# Patient Record
Sex: Male | Born: 1970 | Race: White | Hispanic: No | Marital: Single | Smoking: Never smoker
Health system: Southern US, Community
[De-identification: ages and names within clinical notes are randomized; demographics above are authoritative.]

## PROBLEM LIST (undated history)

## (undated) DIAGNOSIS — N200 Calculus of kidney: Secondary | ICD-10-CM

## (undated) HISTORY — PX: URETHRAL DILATION: SUR417

## (undated) HISTORY — PX: SPLENECTOMY, TOTAL: SHX788

---

## 2000-12-23 ENCOUNTER — Emergency Department (HOSPITAL_COMMUNITY): Admission: EM | Admit: 2000-12-23 | Discharge: 2000-12-23 | Payer: Self-pay | Admitting: *Deleted

## 2001-01-04 ENCOUNTER — Ambulatory Visit (HOSPITAL_COMMUNITY): Admission: RE | Admit: 2001-01-04 | Discharge: 2001-01-04 | Payer: Self-pay | Admitting: Urology

## 2001-01-04 ENCOUNTER — Encounter: Payer: Self-pay | Admitting: Urology

## 2007-11-28 ENCOUNTER — Emergency Department (HOSPITAL_COMMUNITY): Admission: EM | Admit: 2007-11-28 | Discharge: 2007-11-28 | Payer: Self-pay | Admitting: Emergency Medicine

## 2008-01-19 ENCOUNTER — Ambulatory Visit (HOSPITAL_COMMUNITY): Admission: RE | Admit: 2008-01-19 | Discharge: 2008-01-19 | Payer: Self-pay | Admitting: Urology

## 2008-01-23 ENCOUNTER — Encounter (HOSPITAL_COMMUNITY): Admission: RE | Admit: 2008-01-23 | Discharge: 2008-02-22 | Payer: Self-pay | Admitting: Orthopaedic Surgery

## 2008-02-08 ENCOUNTER — Encounter (INDEPENDENT_AMBULATORY_CARE_PROVIDER_SITE_OTHER): Payer: Self-pay | Admitting: Urology

## 2008-02-08 ENCOUNTER — Ambulatory Visit (HOSPITAL_COMMUNITY): Admission: RE | Admit: 2008-02-08 | Discharge: 2008-02-08 | Payer: Self-pay | Admitting: Urology

## 2009-08-29 ENCOUNTER — Inpatient Hospital Stay (HOSPITAL_COMMUNITY)
Admission: EM | Admit: 2009-08-29 | Discharge: 2009-09-03 | Payer: Self-pay | Source: Home / Self Care | Admitting: Emergency Medicine

## 2009-08-30 ENCOUNTER — Encounter (INDEPENDENT_AMBULATORY_CARE_PROVIDER_SITE_OTHER): Payer: Self-pay

## 2010-09-01 ENCOUNTER — Emergency Department (HOSPITAL_COMMUNITY)
Admission: EM | Admit: 2010-09-01 | Discharge: 2010-09-02 | Disposition: A | Discharge status: Home / Self Care | Payer: Worker's Compensation | Attending: Emergency Medicine | Admitting: Emergency Medicine

## 2010-09-01 DIAGNOSIS — W260XXA Contact with knife, initial encounter: Secondary | ICD-10-CM | POA: Insufficient documentation

## 2010-09-01 DIAGNOSIS — W261XXA Contact with sword or dagger, initial encounter: Secondary | ICD-10-CM | POA: Insufficient documentation

## 2010-09-01 DIAGNOSIS — Y929 Unspecified place or not applicable: Secondary | ICD-10-CM | POA: Insufficient documentation

## 2010-09-01 DIAGNOSIS — S61209A Unspecified open wound of unspecified finger without damage to nail, initial encounter: Secondary | ICD-10-CM | POA: Insufficient documentation

## 2010-09-07 LAB — TYPE AND SCREEN
ABO/RH(D): A POS
Antibody Screen: NEGATIVE

## 2010-09-07 LAB — CBC
HCT: 24.6 % — ABNORMAL LOW (ref 39.0–52.0)
Hemoglobin: 8.7 g/dL — ABNORMAL LOW (ref 13.0–17.0)
Hemoglobin: 9.4 g/dL — ABNORMAL LOW (ref 13.0–17.0)
MCHC: 34.9 g/dL (ref 30.0–36.0)
MCHC: 35 g/dL (ref 30.0–36.0)
MCHC: 35.2 g/dL (ref 30.0–36.0)
MCHC: 35.3 g/dL (ref 30.0–36.0)
MCHC: 35.6 g/dL (ref 30.0–36.0)
MCV: 91.3 fL (ref 78.0–100.0)
MCV: 92.7 fL (ref 78.0–100.0)
MCV: 92.8 fL (ref 78.0–100.0)
Platelets: 140 10*3/uL — ABNORMAL LOW (ref 150–400)
Platelets: 345 10*3/uL (ref 150–400)
RBC: 2.89 MIL/uL — ABNORMAL LOW (ref 4.22–5.81)
RBC: 3.92 MIL/uL — ABNORMAL LOW (ref 4.22–5.81)
RDW: 12.6 % (ref 11.5–15.5)
RDW: 12.8 % (ref 11.5–15.5)
RDW: 13.2 % (ref 11.5–15.5)
WBC: 14.9 10*3/uL — ABNORMAL HIGH (ref 4.0–10.5)
WBC: 28.7 10*3/uL — ABNORMAL HIGH (ref 4.0–10.5)

## 2010-09-07 LAB — POCT I-STAT 7, (LYTES, BLD GAS, ICA,H+H)
Calcium, Ion: 0.98 mmol/L — ABNORMAL LOW (ref 1.12–1.32)
HCT: 27 % — ABNORMAL LOW (ref 39.0–52.0)
O2 Saturation: 100 %
Patient temperature: 37
Potassium: 3.4 mEq/L — ABNORMAL LOW (ref 3.5–5.1)
TCO2: 23 mmol/L (ref 0–100)
pCO2 arterial: 44.2 mmHg (ref 35.0–45.0)
pCO2 arterial: 46.8 mmHg — ABNORMAL HIGH (ref 35.0–45.0)
pO2, Arterial: 299 mmHg — ABNORMAL HIGH (ref 80.0–100.0)
pO2, Arterial: 431 mmHg — ABNORMAL HIGH (ref 80.0–100.0)

## 2010-09-07 LAB — POCT I-STAT, CHEM 8
Creatinine, Ser: 1.4 mg/dL (ref 0.4–1.5)
Glucose, Bld: 135 mg/dL — ABNORMAL HIGH (ref 70–99)
Hemoglobin: 7.1 g/dL — ABNORMAL LOW (ref 13.0–17.0)
Potassium: 3.1 mEq/L — ABNORMAL LOW (ref 3.5–5.1)

## 2010-09-07 LAB — BASIC METABOLIC PANEL
CO2: 25 mEq/L (ref 19–32)
CO2: 30 mEq/L (ref 19–32)
Calcium: 7.2 mg/dL — ABNORMAL LOW (ref 8.4–10.5)
Calcium: 8 mg/dL — ABNORMAL LOW (ref 8.4–10.5)
Creatinine, Ser: 1.36 mg/dL (ref 0.4–1.5)
GFR calc Af Amer: >60 mL/min (ref >60)
GFR calc Af Amer: >60 mL/min (ref >60)
Sodium: 138 mEq/L (ref 135–145)

## 2010-09-07 LAB — POCT I-STAT 3, ART BLOOD GAS (G3+): Patient temperature: 96

## 2010-09-07 LAB — DIFFERENTIAL
Basophils Relative: 0 % (ref 0–1)
Lymphocytes Relative: 10 % — ABNORMAL LOW (ref 12–46)
Lymphs Abs: 1.4 10*3/uL (ref 0.7–4.0)
Monocytes Absolute: 1.8 10*3/uL — ABNORMAL HIGH (ref 0.1–1.0)
Monocytes Relative: 12 % (ref 3–12)
Neutro Abs: 11.5 10*3/uL — ABNORMAL HIGH (ref 1.7–7.7)

## 2010-09-07 LAB — MRSA PCR SCREENING: MRSA by PCR: NEGATIVE

## 2010-10-27 NOTE — Op Note (Signed)
NAME:  MCGREGOR, TINNON NO.:  [id number]   MEDICAL RECORD NO.:  [id number]          PATIENT TYPE:  AMB   LOCATION:  DAY                           FACILITY:  [facility]   PHYSICIAN:  Dennie Maizes, M.D.   DATE OF BIRTH:  07-30-1970   DATE OF PROCEDURE:  02/08/2008  DATE OF DISCHARGE:                               OPERATIVE REPORT   PREOPERATIVE DIAGNOSES:  Voiding difficulty, urethral stricture.   POSTOPERATIVE DIAGNOSES:  Voiding difficulty, urethral stricture,  ureteral calculus (5 mm).   ANESTHESIA:  General.   PROCEDURES:  Cystoscopy, visual internal urethrotomy, and removal of  urethral calculus.   SURGEON:  Dennie Maizes, MD.   COMPLICATIONS:  None.   ESTIMATED BLOOD LOSS:  Minimal.   DRAINS:  An 18-French Foley catheter in the bladder.   SPECIMEN:  Ureteral calculus was sent for chemical analysis.   INDICATIONS FOR THE PROCEDURE:  This 40 year old male has a history of  urethral stricture in the past.  He had voiding difficulty, slow urinary  stream, and left flank pain.  He was brought to the operating room today  for cystoscopy, visual internal urethrotomy, and possible removal of the  calculus.   DESCRIPTION OF PROCEDURE:  General anesthesia was induced and the  patient was placed on the OR table in the dorsal lithotomy position.  The lower abdomen and genitalia were prepped and draped in a sterile  fashion.  Cystoscopy was done with a 25-French scope.  The anterior  urethra was normal.  There was very narrow, bulbous urethral stricture.  A 4-French ureteral catheter was then inserted through the lumen of the  stricture into the bladder.  A 22-French visual urethrotome was then  inserted and under direct vision, the stricture was incised at 12  o'clock position.  The stricture was 2.5 cm in length and there were  multiple strictures, which were incised.  There was a stone, 5-mm size  stone just proximal to the stricture.  The stone was pushed into  the  bladder with the beak of the instrument.  Bladder was examined and found  to be normal.  The trigone, ureteral orifice, and bladder mucosa were  unremarkable.   The Toomey syringe irrigation of bladder was done.  The 5-mm size  calculus removed and sent for chemical analysis.  The stricture was  reinspected during withdrawal of the scope.  There was no active  bleeding and the  stricture was found to be adequately incised.  An 18-French Silastic  Foley catheter was inserted into the bladder without any difficulty.  Clear urine was drained.  The patient was transferred to the PACU in a  satisfactory condition.      Dennie Maizes, M.D.  Electronically Signed     SK/MEDQ  D:  02/08/2008  T:  02/09/2008  Job:  [id number]   cc:   Corrie Mckusick, M.D.  Fax: [phone]

## 2010-10-27 NOTE — H&P (Signed)
NAME:  Stephen Daugherty, Stephen Daugherty NO.:  [id number]   MEDICAL RECORD NO.:  [id number]          PATIENT TYPE:  AMB   LOCATION:  DAY                           FACILITY:  [facility]   PHYSICIAN:  Dennie Maizes, M.D.   DATE OF BIRTH:  Oct 28, 1970   DATE OF ADMISSION:  DATE OF DISCHARGE:  LH                              HISTORY & PHYSICAL   He is coming to the short-stay center on February 08, 2008.   CHIEF COMPLAINT:  Left flank pain, hematuria, voiding difficulty.   HISTORY OF PRESENT ILLNESS:  This 40 year old male was referred to me by  Dr. Phillips Odor.  He has a past history of recurrent urolithiasis.  He has  passed several stones.  He had been having intermittent left flank pain  associated with mild hematuria and voiding difficulty for 2 weeks.  He  has urinary frequency times 2-3 and nocturia times 0-1.  He has slow  urinary stream and he has to strain to empty the bladder.  He feels the  stone is packed inside the penis.  He has been evaluated with a CT scan  of the abdomen and pelvis with and without contrast at [facility].  This revealed bilateral small renal calculi without  obstruction.   The patient has a history of urethral stricture in the past.  He has  undergone cystoscopy with urethral dilation at [facility] a few years ago.   PAST MEDICAL HISTORY:  1. Recurrent urolithiasis.  2. History of urethral stricture.   MEDICATIONS:  None.   ALLERGIES:  NONE.   FAMILY HISTORY:  Positive for kidney stones.   PHYSICAL EXAMINATION:  VITAL SIGNS:  Height 5'7, weight 135 pounds.  HEAD, EYES, EARS, NOSE AND THROAT:  Normal.  LUNGS:  Clear to auscultation.  HEART:  Regular rate and rhythm.  No murmurs.  ABDOMEN:  Soft.  No palpable flank mass or CVA tenderness.  Bladder is  not palpable.  Penis and testes are normal   IMPRESSION:  Left flank pain, hematuria, history of urethral stricture.   PLAN:  Cystoscopy, visual internal urethrotomy and  removal of the stone  if found.  I have discussed with the patient regarding the diagnosis,  operative details, alternative treatments, outcome, possible risks and  complications and he has agreed for the procedure to be done.      Dennie Maizes, M.D.  Electronically Signed     SK/MEDQ  D:  02/07/2008  T:  02/07/2008  Job:  [id number]   cc:   Phillips Odor, MD   [facility]

## 2012-12-03 ENCOUNTER — Encounter (HOSPITAL_COMMUNITY): Payer: Self-pay | Admitting: *Deleted

## 2012-12-03 ENCOUNTER — Emergency Department (HOSPITAL_COMMUNITY)
Admission: EM | Admit: 2012-12-03 | Discharge: 2012-12-03 | Disposition: A | Discharge status: Home / Self Care | Payer: Managed Care, Other (non HMO) | Attending: Emergency Medicine | Admitting: Emergency Medicine

## 2012-12-03 DIAGNOSIS — R110 Nausea: Secondary | ICD-10-CM | POA: Insufficient documentation

## 2012-12-03 DIAGNOSIS — R42 Dizziness and giddiness: Secondary | ICD-10-CM

## 2012-12-03 MED ORDER — MECLIZINE HCL 12.5 MG PO TABS
25.0000 mg | ORAL_TABLET | Freq: Once | ORAL | Status: AC
  Administered 2012-12-03: 25 mg via ORAL
  Filled 2012-12-03: qty 2

## 2012-12-03 MED ORDER — MECLIZINE HCL 25 MG PO TABS: ORAL_TABLET | ORAL | Status: DC

## 2012-12-03 MED ORDER — ONDANSETRON 4 MG PO TBDP: 4.0000 mg | ORAL_TABLET | Freq: Three times a day (TID) | ORAL | Status: DC | PRN

## 2012-12-03 MED ORDER — ONDANSETRON 8 MG PO TBDP
8.0000 mg | ORAL_TABLET | Freq: Once | ORAL | Status: AC
  Administered 2012-12-03: 8 mg via ORAL
  Filled 2012-12-03: qty 1

## 2012-12-03 NOTE — ED Notes (Signed)
Pt c/o room spinning with movement, nausea that started yesterday, pt states that once he sits down or lays down the "spinning" sensation will stop.

## 2012-12-03 NOTE — ED Provider Notes (Signed)
History     CSN: [id number]  Arrival date & time 12/03/12  1722   First MD Initiated Contact with Patient 12/03/12 1730      Chief Complaint  Patient presents with  . Dizziness    (Consider location/radiation/quality/duration/timing/severity/associated sxs/prior treatment) HPI  Patient states yesterday they went to a park or hiking and he started having a faint dizzy feeling like he was spinning. He had mild nausea with it and it only lasted a short while. He relates this morning it returned. He states as soon as he woke up and he tried to sit up he felt like things were spinning. He has had nausea most of the day. He denies vomiting, headache, numbness or tingling in his arms or legs, chest pain or shortness of breath. He states he's never had this before. He states quick movements of his head will make it worse. He states however his mother has had vertigo several times before.  PCP Dr Phillips Odor  History reviewed. No pertinent past medical history.  Past Surgical History  Procedure Laterality Date  . Splenectomy, total      No family history on file.  History  Substance Use Topics  . Smoking status: Not on file  . Smokeless tobacco: Not on file  . Alcohol Use: Not on file  smokes 1/2 ppd Drinks occasionally Employed  Uses snuff    Review of Systems  All other systems reviewed and are negative.    Allergies  Review of patient's allergies indicates no known allergies.  Home Medications   Current Outpatient Rx  Name  Route  Sig  Dispense  Refill  . meclizine (ANTIVERT) 25 MG tablet      Take 1 or 2 po Q 6hrs for dizziness   40 tablet   0   . ondansetron (ZOFRAN ODT) 4 MG disintegrating tablet   Oral   Take 1 tablet (4 mg total) by mouth every 8 (eight) hours as needed for nausea (or dizziness).   10 tablet   0     BP 160/96  Pulse 75  Temp(Src) 97.6 F (36.4 C) (Oral)  Resp 20  Ht 5\' 7"  (1.702 m)  Wt 150 lb (68.04 kg)  BMI 23.49 kg/m2  SpO2  100%  Vital signs normal    Physical Exam  Nursing note and vitals reviewed. Constitutional: He is oriented to person, place, and time. He appears well-developed and well-nourished.  Non-toxic appearance. He does not appear ill. No distress.  HENT:  Head: Normocephalic and atraumatic.  Right Ear: External ear normal.  Left Ear: External ear normal.  Nose: Nose normal. No mucosal edema or rhinorrhea.  Mouth/Throat: Oropharynx is clear and moist and mucous membranes are normal. No dental abscesses or edematous.  Eyes: Conjunctivae and EOM are normal. Pupils are equal, round, and reactive to light. Right eye exhibits no nystagmus. Left eye exhibits no nystagmus.  Neck: Normal range of motion and full passive range of motion without pain. Neck supple.  Cardiovascular: Normal rate and regular rhythm.  Exam reveals no gallop and no friction rub.   No murmur heard. Pulmonary/Chest: Effort normal and breath sounds normal. No respiratory distress. He has no wheezes. He has no rhonchi. He has no rales. He exhibits no tenderness and no crepitus.  Abdominal: Soft. Normal appearance and bowel sounds are normal. He exhibits no distension.  Musculoskeletal: Normal range of motion.  Moves all extremities well.   Neurological: He is alert and oriented to person, place, and time.  He has normal strength. No cranial nerve deficit.  Skin: Skin is warm, dry and intact. No rash noted. No erythema. No pallor.  Psychiatric: He has a normal mood and affect. His speech is normal and behavior is normal. His mood appears not anxious.    ED Course  Procedures (including critical care time)  Medications  ondansetron (ZOFRAN-ODT) disintegrating tablet 8 mg (8 mg Oral Given 12/03/12 1749)  meclizine (ANTIVERT) tablet 25 mg (25 mg Oral Given 12/03/12 1749)  meclizine (ANTIVERT) tablet 25 mg (25 mg Oral Given 12/03/12 1846)     Recheck 18:40 pt wants his ears to be checked, advised I cannot see his inner ear, however  his ears were checked and his canals and TM's were normal bilaterally. He was able to sit up and still had some dizziness mainly when he looked up, given a second dose of meclizine.     1. Vertigo     Discharge Medication List as of 12/03/2012  7:59 PM    START taking these medications   Details  meclizine (ANTIVERT) 25 MG tablet Take 1 or 2 po Q 6hrs for dizziness, Print    ondansetron (ZOFRAN ODT) 4 MG disintegrating tablet Take 1 tablet (4 mg total) by mouth every 8 (eight) hours as needed for nausea (or dizziness)., Starting 12/03/2012, Until Discontinued, Print        Plan discharge   Devoria Albe, MD, FACEP   MDM          Ward Givens, MD 12/03/12 2135

## 2012-12-03 NOTE — ED Notes (Signed)
Pt alert & oriented x4, stable gait. Patient given discharge instructions, paperwork & prescription(s). Patient  instructed to stop at the registration desk to finish any additional paperwork. Patient verbalized understanding. Pt left department w/ no further questions. 

## 2012-12-03 NOTE — ED Notes (Signed)
Pt stood & stepped around the room states he is feeling better than when he came in. Pt thinks he is ready to go.

## 2014-07-19 ENCOUNTER — Other Ambulatory Visit (HOSPITAL_COMMUNITY): Payer: Self-pay | Admitting: Family Medicine

## 2014-07-19 ENCOUNTER — Ambulatory Visit (HOSPITAL_COMMUNITY)
Admission: RE | Admit: 2014-07-19 | Discharge: 2014-07-19 | Disposition: A | Discharge status: Home / Self Care | Payer: Managed Care, Other (non HMO) | Source: Ambulatory Visit | Attending: Family Medicine | Admitting: Family Medicine

## 2014-07-19 DIAGNOSIS — M25512 Pain in left shoulder: Secondary | ICD-10-CM

## 2014-07-19 DIAGNOSIS — M542 Cervicalgia: Secondary | ICD-10-CM | POA: Diagnosis not present

## 2015-09-12 ENCOUNTER — Encounter (HOSPITAL_COMMUNITY): Payer: Self-pay | Admitting: Emergency Medicine

## 2015-09-12 ENCOUNTER — Emergency Department (HOSPITAL_COMMUNITY): Payer: 59

## 2015-09-12 ENCOUNTER — Emergency Department (HOSPITAL_COMMUNITY)
Admission: EM | Admit: 2015-09-12 | Discharge: 2015-09-12 | Disposition: A | Discharge status: Home / Self Care | Payer: 59 | Attending: Emergency Medicine | Admitting: Emergency Medicine

## 2015-09-12 DIAGNOSIS — R109 Unspecified abdominal pain: Secondary | ICD-10-CM

## 2015-09-12 DIAGNOSIS — R1032 Left lower quadrant pain: Secondary | ICD-10-CM | POA: Insufficient documentation

## 2015-09-12 DIAGNOSIS — N200 Calculus of kidney: Secondary | ICD-10-CM | POA: Diagnosis not present

## 2015-09-12 HISTORY — DX: Calculus of kidney: N20.0

## 2015-09-12 LAB — COMPREHENSIVE METABOLIC PANEL
ALT: 31 U/L (ref 17–63)
AST: 24 U/L (ref 15–41)
Albumin: 4.4 g/dL (ref 3.5–5.0)
Alkaline Phosphatase: 33 U/L — ABNORMAL LOW (ref 38–126)
Anion gap: 7 (ref 5–15)
BILIRUBIN TOTAL: 0.6 mg/dL (ref 0.3–1.2)
BUN: 15 mg/dL (ref 6–20)
CHLORIDE: 106 mmol/L (ref 101–111)
CO2: 28 mmol/L (ref 22–32)
CREATININE: 0.96 mg/dL (ref 0.61–1.24)
Calcium: 9.2 mg/dL (ref 8.9–10.3)
Glucose, Bld: 85 mg/dL (ref 65–99)
Potassium: 4.1 mmol/L (ref 3.5–5.1)
Sodium: 141 mmol/L (ref 135–145)
TOTAL PROTEIN: 7.7 g/dL (ref 6.5–8.1)

## 2015-09-12 LAB — CBC WITH DIFFERENTIAL/PLATELET
Basophils Absolute: 0.1 10*3/uL (ref 0.0–0.1)
Basophils Relative: 1 %
Eosinophils Absolute: 0.1 10*3/uL (ref 0.0–0.7)
Eosinophils Relative: 2 %
HEMATOCRIT: 41 % (ref 39.0–52.0)
Hemoglobin: 13.6 g/dL (ref 13.0–17.0)
Lymphocytes Relative: 43 %
Lymphs Abs: 3.2 10*3/uL (ref 0.7–4.0)
MCH: 31.1 pg (ref 26.0–34.0)
MCHC: 33.2 g/dL (ref 30.0–36.0)
MCV: 93.8 fL (ref 78.0–100.0)
MONOS PCT: 11 %
Monocytes Absolute: 0.8 10*3/uL (ref 0.1–1.0)
NEUTROS PCT: 43 %
Neutro Abs: 3.3 10*3/uL (ref 1.7–7.7)
PLATELETS: 376 10*3/uL (ref 150–400)
RBC: 4.37 MIL/uL (ref 4.22–5.81)
RDW: 13.3 % (ref 11.5–15.5)
WBC: 7.6 10*3/uL (ref 4.0–10.5)

## 2015-09-12 LAB — URINE MICROSCOPIC-ADD ON
Bacteria, UA: NONE SEEN
SQUAMOUS EPITHELIAL / LPF: NONE SEEN

## 2015-09-12 LAB — URINALYSIS, ROUTINE W REFLEX MICROSCOPIC
BILIRUBIN URINE: NEGATIVE
Glucose, UA: NEGATIVE mg/dL
KETONES UR: NEGATIVE mg/dL
NITRITE: NEGATIVE
PH: 6 (ref 5.0–8.0)
PROTEIN: NEGATIVE mg/dL
Specific Gravity, Urine: 1.01 (ref 1.005–1.030)

## 2015-09-12 MED ORDER — HYDROCODONE-ACETAMINOPHEN 5-325 MG PO TABS: 1.0000 | ORAL_TABLET | Freq: Four times a day (QID) | ORAL | Status: DC | PRN

## 2015-09-12 MED ORDER — ONDANSETRON 4 MG PO TBDP: ORAL_TABLET | ORAL | Status: DC

## 2015-09-12 NOTE — Discharge Instructions (Signed)
Follow-up with [facility] next week. Return if problems before then

## 2015-09-12 NOTE — ED Provider Notes (Signed)
CSN: [id number]     Arrival date & time 09/12/15  1035 History   First MD Initiated Contact with Patient 09/12/15 1522     Chief Complaint  Patient presents with  . Groin Pain     (Consider location/radiation/quality/duration/timing/severity/associated sxs/prior Treatment) Patient is a 45 y.o. male presenting with groin pain. The history is provided by the patient (Patient history kidney stones is complaining of left groin pain. He suspects he had a kidney stone).  Groin Pain This is a new problem. The current episode started 2 days ago. The problem occurs constantly. The problem has not changed since onset.Associated symptoms include abdominal pain. Pertinent negatives include no chest pain and no headaches. Nothing aggravates the symptoms. Nothing relieves the symptoms.    Past Medical History  Diagnosis Date  . Kidney stone    Past Surgical History  Procedure Laterality Date  . Splenectomy, total    . Urethral dilation     History reviewed. No pertinent family history. Social History  Substance Use Topics  . Smoking status: Never Smoker   . Smokeless tobacco: None  . Alcohol Use: No    Review of Systems  Constitutional: Negative for appetite change and fatigue.  HENT: Negative for congestion, ear discharge and sinus pressure.   Eyes: Negative for discharge.  Respiratory: Negative for cough.   Cardiovascular: Negative for chest pain.  Gastrointestinal: Positive for abdominal pain. Negative for diarrhea.       Left lower quadrant and groin pain  Genitourinary: Negative for frequency and hematuria.  Musculoskeletal: Negative for back pain.  Skin: Negative for rash.  Neurological: Negative for seizures and headaches.  Psychiatric/Behavioral: Negative for hallucinations.      Allergies  Review of patient's allergies indicates no known allergies.  Home Medications   Prior to Admission medications   Medication Sig Start Date End Date Taking? Authorizing Provider   HYDROcodone-acetaminophen (NORCO/VICODIN) 5-325 MG tablet Take 1 tablet by mouth every 6 (six) hours as needed for moderate pain. 09/12/15   Bethann BerkshireJoseph Niam Nepomuceno, MD  meclizine (ANTIVERT) 25 MG tablet Take 1 or 2 po Q 6hrs for dizziness 12/03/12   Devoria AlbeIva Knapp, MD  ondansetron (ZOFRAN ODT) 4 MG disintegrating tablet 4mg  ODT q4 hours prn nausea/vomit 09/12/15   Bethann BerkshireJoseph Jnae Thomaston, MD   BP 129/83 mmHg  Pulse 68  Temp(Src) 98.7 F (37.1 C) (Oral)  Resp 16  SpO2 97% Physical Exam  Constitutional: He is oriented to person, place, and time. He appears well-developed.  HENT:  Head: Normocephalic.  Eyes: Conjunctivae and EOM are normal. No scleral icterus.  Neck: Neck supple. No thyromegaly present.  Cardiovascular: Normal rate and regular rhythm.  Exam reveals no gallop and no friction rub.   No murmur heard. Pulmonary/Chest: No stridor. He has no wheezes. He has no rales. He exhibits no tenderness.  Abdominal: He exhibits no distension. There is tenderness. There is no rebound.  Mild tenderness left lower quadrant  Musculoskeletal: Normal range of motion. He exhibits no edema.  Lymphadenopathy:    He has no cervical adenopathy.  Neurological: He is oriented to person, place, and time. He exhibits normal muscle tone. Coordination normal.  Skin: No rash noted. No erythema.  Psychiatric: He has a normal mood and affect. His behavior is normal.    ED Course  Procedures (including critical care time) Labs Review Labs Reviewed  URINALYSIS, ROUTINE W REFLEX MICROSCOPIC (NOT [facility]) - Abnormal; Notable for the following:    Hgb urine dipstick TRACE (*)  Leukocytes, UA TRACE (*)    All other components within normal limits  COMPREHENSIVE METABOLIC PANEL - Abnormal; Notable for the following:    Alkaline Phosphatase 33 (*)    All other components within normal limits  CBC WITH DIFFERENTIAL/PLATELET  URINE MICROSCOPIC-ADD ON    Imaging Review Ct Renal Stone Study  09/12/2015  CLINICAL DATA:   45 year old male with 1 week of flank pain, now radiating to the groin. Small volume gross hematuria for 2 days. Initial encounter. EXAM: CT ABDOMEN AND PELVIS WITHOUT CONTRAST TECHNIQUE: Multidetector CT imaging of the abdomen and pelvis was performed following the standard protocol without IV contrast. COMPARISON:  CT Abdomen and Pelvis 01/19/2008 FINDINGS: Negative lung bases.  No pericardial or pleural effusion. No acute osseous abnormality identified. No pelvic free fluid. Unremarkable rectum. 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 Redundant but otherwise negative sigmoid colon. Negative left colon. More retained stool in the transverse colon and at the flexures. Negative right colon and appendix. Negative terminal ileum. No dilated small bowel. Negative stomach and duodenum. No abdominal free fluid or free air. Negative noncontrast liver, gallbladder, pancreas and adrenal glands. Spleen is diminutive and significantly decreased in volume since 2009 but otherwise within normal limits. No lymphadenopathy identified. Punctate bilateral nonobstructing renal calculi, 3-5 small stones in each kidney. No hydronephrosis or perinephric stranding. Negative course of the left ureter. Negative course of the right ureter. Unremarkable urinary bladder. New small pelvic phleboliths on the right. No hydroureter or ureteral calculus. IMPRESSION: 1. Punctate bilateral nonobstructing renal calculi. No ureteral calculus or obstructive uropathy. 2. No acute findings identified in the noncontrast abdomen. Normal appendix. 3. Diminutive spleen, much smaller than in 2009 but otherwise within normal limits. No acute clinical significance suspected, but query interval splenic surgery (no surgical clips identified) or infarct. Electronically Signed   By: Odessa Fleming M.D.   On: 09/12/2015 16:40   I have personally reviewed and evaluated these images and lab results as part of my medical decision-making.   EKG  Interpretation None      MDM   Final diagnoses:  Abdominal pain in male    Labs unremarkable CT scan shows kidney stones in the kidneys but none in his ureters.   Patient feels this is still kidney stone related. He wishes to follow-up with urology. Patient given pain medicine and nausea medicine and referral to [facility]    Bethann Berkshire, MD 09/12/15 [phone]

## 2015-09-12 NOTE — ED Notes (Signed)
PT c/o flank pain x1 week ago now groin pain and states hx of kidney stones and difficulty with urination and denies any increased frequency of blood.

## 2015-09-16 ENCOUNTER — Ambulatory Visit (INDEPENDENT_AMBULATORY_CARE_PROVIDER_SITE_OTHER): Payer: 59 | Admitting: Urology

## 2015-09-16 DIAGNOSIS — N211 Calculus in urethra: Secondary | ICD-10-CM | POA: Diagnosis not present

## 2015-09-16 DIAGNOSIS — N200 Calculus of kidney: Secondary | ICD-10-CM | POA: Diagnosis not present

## 2015-09-23 ENCOUNTER — Ambulatory Visit (INDEPENDENT_AMBULATORY_CARE_PROVIDER_SITE_OTHER): Payer: 59 | Admitting: Urology

## 2015-09-23 DIAGNOSIS — N201 Calculus of ureter: Secondary | ICD-10-CM | POA: Diagnosis not present

## 2015-11-25 ENCOUNTER — Ambulatory Visit (INDEPENDENT_AMBULATORY_CARE_PROVIDER_SITE_OTHER): Payer: 59 | Admitting: Urology

## 2015-11-25 DIAGNOSIS — N209 Urinary calculus, unspecified: Secondary | ICD-10-CM

## 2015-11-25 DIAGNOSIS — N35010 Post-traumatic urethral stricture, male, meatal: Secondary | ICD-10-CM

## 2016-03-30 ENCOUNTER — Ambulatory Visit (INDEPENDENT_AMBULATORY_CARE_PROVIDER_SITE_OTHER): Payer: 59 | Admitting: Urology

## 2016-03-30 DIAGNOSIS — N358 Other urethral stricture: Secondary | ICD-10-CM

## 2017-01-31 DIAGNOSIS — I10 Essential (primary) hypertension: Secondary | ICD-10-CM | POA: Diagnosis not present

## 2017-01-31 DIAGNOSIS — E782 Mixed hyperlipidemia: Secondary | ICD-10-CM | POA: Diagnosis not present

## 2017-01-31 DIAGNOSIS — Z6822 Body mass index (BMI) 22.0-22.9, adult: Secondary | ICD-10-CM | POA: Diagnosis not present

## 2017-01-31 DIAGNOSIS — Z1389 Encounter for screening for other disorder: Secondary | ICD-10-CM | POA: Diagnosis not present

## 2018-01-06 DIAGNOSIS — E782 Mixed hyperlipidemia: Secondary | ICD-10-CM | POA: Diagnosis not present

## 2018-01-06 DIAGNOSIS — Z6823 Body mass index (BMI) 23.0-23.9, adult: Secondary | ICD-10-CM | POA: Diagnosis not present

## 2018-01-06 DIAGNOSIS — I10 Essential (primary) hypertension: Secondary | ICD-10-CM | POA: Diagnosis not present

## 2020-04-28 ENCOUNTER — Ambulatory Visit
Admission: EM | Admit: 2020-04-28 | Discharge: 2020-04-28 | Disposition: A | Discharge status: Home / Self Care | Payer: 59 | Attending: Emergency Medicine | Admitting: Emergency Medicine

## 2020-04-28 ENCOUNTER — Telehealth: Payer: Self-pay

## 2020-04-28 ENCOUNTER — Other Ambulatory Visit: Payer: Self-pay

## 2020-04-28 DIAGNOSIS — N3001 Acute cystitis with hematuria: Secondary | ICD-10-CM | POA: Insufficient documentation

## 2020-04-28 DIAGNOSIS — R109 Unspecified abdominal pain: Secondary | ICD-10-CM | POA: Insufficient documentation

## 2020-04-28 LAB — POCT URINALYSIS DIP (MANUAL ENTRY)
Bilirubin, UA: NEGATIVE
Glucose, UA: NEGATIVE mg/dL
Ketones, POC UA: NEGATIVE mg/dL
Nitrite, UA: NEGATIVE
Protein Ur, POC: 30 mg/dL — AB
Spec Grav, UA: >=1.03 — AB (ref 1.010–1.025)
Urobilinogen, UA: 0.2 E.U./dL
pH, UA: 6 (ref 5.0–8.0)

## 2020-04-28 MED ORDER — SULFAMETHOXAZOLE-TRIMETHOPRIM 800-160 MG PO TABS: 1.0000 | ORAL_TABLET | Freq: Two times a day (BID) | ORAL | 0 refills | Status: AC

## 2020-04-28 MED ORDER — LISINOPRIL 10 MG PO TABS: 10.0000 mg | ORAL_TABLET | Freq: Every day | ORAL | 2 refills | Status: AC

## 2020-04-28 MED ORDER — TAMSULOSIN HCL 0.4 MG PO CAPS: 0.4000 mg | ORAL_CAPSULE | Freq: Every day | ORAL | 0 refills | Status: DC

## 2020-04-28 NOTE — ED Provider Notes (Signed)
[facility]  Chief Complaint  Patient presents with   Flank Pain     SUBJECTIVE:  Stephen Daugherty is a 49 y.o. male who presented to the urgent care with a complaint of right flank pain that started last night.  Patient denies a precipitating event.  Localizes the pain to the right flank.  Pain is intermittent and describes it as  achy.  Has tried OTC medications without relief.  Symptoms are made worse with urination.  Admits to similar symptoms in the past.  Denies fever, chills, nausea, vomiting, abdominal pain, abnormal vaginal discharge or bleeding, hematuria.     Patient stated he want to have his blood pressure medication refilled.  LMP: No LMP for male patient.  ROS: As in HPI.  All other pertinent ROS negative.     Past Medical History:  Diagnosis Date   Kidney stone    Past Surgical History:  Procedure Laterality Date   SPLENECTOMY, TOTAL     URETHRAL DILATION     No Known Allergies No current facility-administered medications on file prior to encounter.   Current Outpatient Medications on File Prior to Encounter  Medication Sig Dispense Refill   lisinopril (ZESTRIL) 10 MG tablet      HYDROcodone-acetaminophen (NORCO/VICODIN) 5-325 MG tablet Take 1 tablet by mouth every 6 (six) hours as needed for moderate pain. 15 tablet 0   meclizine (ANTIVERT) 25 MG tablet Take 1 or 2 po Q 6hrs for dizziness 40 tablet 0   ondansetron (ZOFRAN ODT) 4 MG disintegrating tablet 4mg  ODT q4 hours prn nausea/vomit 12 tablet 0   Social History   Socioeconomic History   Marital status: Single    Spouse name: Not on file   Number of children: Not on file   Years of education: Not on file   Highest education level: Not on file  Occupational History   Not on file  Tobacco Use   Smoking status: Never Smoker  Substance and Sexual Activity   Alcohol use: No   Drug use: No   Sexual activity: Not on file  Other Topics Concern   Not on file  Social  History Narrative   Not on file   Social Determinants of Health   Financial Resource Strain:    Difficulty of Paying Living Expenses: Not on file  Food Insecurity:    Worried About Running Out of Food in the Last Year: Not on file   Ran Out of Food in the Last Year: Not on file  Transportation Needs:    Lack of Transportation (Medical): Not on file   Lack of Transportation (Non-Medical): Not on file  Physical Activity:    Days of Exercise per Week: Not on file   Minutes of Exercise per Session: Not on file  Stress:    Feeling of Stress : Not on file  Social Connections:    Frequency of Communication with Friends and Family: Not on file   Frequency of Social Gatherings with Friends and Family: Not on file   Attends Religious Services: Not on file   Active Member of Clubs or Organizations: Not on file   Attends Meetings: Not on file   Marital Status: Not on file  Intimate Partner Violence:    Fear of Current or Ex-Partner: Not on file   Emotionally Abused: Not on file   Physically Abused: Not on file   Sexually Abused: Not on file   No family history on file.  OBJECTIVE:  Vitals:   04/28/20 1331  BP: (!) 169/110  Pulse: 75  Resp: 20  Temp: 98.1 F (36.7 C)  SpO2: 94%   General appearance: AOx3 in no acute distress HEENT: NCAT.  Oropharynx clear.  Lungs: clear to auscultation bilaterally without adventitious breath sounds Heart: regular rate and rhythm.  Radial pulses 2+ symmetrical bilaterally Abdomen: soft; non-distended; no tenderness; bowel sounds present; no guarding or rebound tenderness Back: no CVA tenderness Extremities: no edema; symmetrical with no gross deformities Skin: warm and dry Neurologic: Ambulates from chair to exam table without difficulty Psychological: alert and cooperative; normal mood and affect  Labs Reviewed  POCT URINALYSIS DIP (MANUAL ENTRY) - Abnormal; Notable for the following components:       Result Value   Color, UA red (*)    Clarity, UA cloudy (*)    Spec Grav, UA >=1.030 (*)    Blood, UA large (*)    Protein Ur, POC =30 (*)    Leukocytes, UA Trace (*)    All other components within normal limits  URINE CULTURE    ASSESSMENT & PLAN:  1. Right flank pain   2. Acute cystitis with hematuria     Meds ordered this encounter  Medications   sulfamethoxazole-trimethoprim (BACTRIM DS) 800-160 MG tablet    Sig: Take 1 tablet by mouth 2 (two) times daily for 10 days.    Dispense:  20 tablet    Refill:  0   tamsulosin (FLOMAX) 0.4 MG CAPS capsule    Sig: Take 1 capsule (0.4 mg total) by mouth daily after breakfast.    Dispense:  30 capsule    Refill:  0    Patient is stable at discharge.  Urine analysis show large RBC with trace of white blood cell.  Symptom is likely from a kidney stone or prostate.  He was advised to follow-up with PCP if symptoms persist and does not resolve after antibiotic and Flomax treatment   Discharge instructions  Urine culture sent.  We will call you with the results.   Push fluids and get plenty of rest.   Take antibiotic as directed and to completion Flomax was prescribed for possible kidney stone Follow up with PCP if symptoms persists Return here or go to ER if you have any new or worsening symptoms such as fever, worsening abdominal pain, nausea/vomiting, flank pain, etc...  Outlined signs and symptoms indicating need for more acute intervention. Patient verbalized understanding. After Visit Summary given.     Durward Parcel, FNP 04/28/20 1400

## 2020-04-28 NOTE — ED Triage Notes (Signed)
Pt presents with right flank pain that began last night, also states his BP has been elevated and needs refill for BP medication after being out for a while

## 2020-04-28 NOTE — Discharge Instructions (Addendum)
Urine culture sent.  We will call you with the results.   Push fluids and get plenty of rest.   Take antibiotic as directed and to completion Flomax was prescribed for possible kidney stone Follow up with PCP if symptoms persists Return here or go to ER if you have any new or worsening symptoms such as fever, worsening abdominal pain, nausea/vomiting, flank pain, etc..Marland Kitchen

## 2020-04-29 LAB — URINE CULTURE: Culture: NO GROWTH

## 2020-05-01 ENCOUNTER — Other Ambulatory Visit: Payer: Self-pay | Admitting: Internal Medicine

## 2020-05-01 ENCOUNTER — Ambulatory Visit (HOSPITAL_COMMUNITY)
Admission: RE | Admit: 2020-05-01 | Discharge: 2020-05-01 | Disposition: A | Discharge status: Home / Self Care | Payer: 59 | Source: Ambulatory Visit | Attending: Internal Medicine | Admitting: Internal Medicine

## 2020-05-01 ENCOUNTER — Other Ambulatory Visit: Payer: Self-pay

## 2020-05-01 ENCOUNTER — Other Ambulatory Visit (HOSPITAL_COMMUNITY): Payer: Self-pay | Admitting: Internal Medicine

## 2020-05-01 DIAGNOSIS — N23 Unspecified renal colic: Secondary | ICD-10-CM

## 2020-05-07 ENCOUNTER — Other Ambulatory Visit: Payer: Self-pay

## 2020-05-07 DIAGNOSIS — N200 Calculus of kidney: Secondary | ICD-10-CM

## 2020-05-07 NOTE — Patient Instructions (Signed)
Your procedure is scheduled on: 05/13/2020  Report to [facility] at    7:15 AM.  Call this number if you have problems the morning of surgery: [phone]   Remember:   Do not Eat or Drink after midnight         No Smoking the morning of surgery  :  Take these medicines the morning of surgery with A SIP OF WATER: zofran if needed   Do not wear jewelry, make-up or nail polish.  Do not wear lotions, powders, or perfumes. You may wear deodorant.  Do not shave 48 hours prior to surgery. Men may shave face and neck.  Do not bring valuables to the hospital.  Contacts, dentures or bridgework may not be worn into surgery.  Leave suitcase in the car. After surgery it may be brought to your room.  For patients admitted to the hospital, checkout time is 11:00 AM the day of discharge.   Patients discharged the day of surgery will not be allowed to drive home.   Lithotripsy, Care After This sheet gives you information about how to care for yourself after your procedure. Your health care provider may also give you more specific instructions. If you have problems or questions, contact your health care provider. What can I expect after the procedure? After the procedure, it is common to have:  Some blood in your urine. This should only last for a few days.  Soreness in your back, sides, or upper abdomen for a few days.  Blotches or bruises on your back where the pressure wave entered the skin.  Pain, discomfort, or nausea when pieces (fragments) of the kidney stone move through the tube that carries urine from the kidney to the bladder (ureter). Stone fragments may pass soon after the procedure, but they may continue to pass for up to 4-8 weeks. ? If you have severe pain or nausea, contact your health care provider. This may be caused by a large stone that was not broken up, and this may mean that you need more treatment.  Some pain or discomfort during urination.  Some pain or discomfort  in the lower abdomen or (in men) at the base of the penis. Follow these instructions at home: Medicines  Take over-the-counter and prescription medicines only as told by your health care provider.  If you were prescribed an antibiotic medicine, take it as told by your health care provider. Do not stop taking the antibiotic even if you start to feel better.  Do not drive for 24 hours if you were given a medicine to help you relax (sedative).  Do not drive or use heavy machinery while taking prescription pain medicine. Eating and drinking      Drink enough water and fluids to keep your urine clear or pale yellow. This helps any remaining pieces of the stone to pass. It can also help prevent new stones from forming.  Eat plenty of fresh fruits and vegetables.  Follow instructions from your health care provider about eating and drinking restrictions. You may be instructed: ? To reduce how much salt (sodium) you eat or drink. Check ingredients and nutrition facts on packaged foods and beverages. ? To reduce how much meat you eat.  Eat the recommended amount of calcium for your age and gender. Ask your health care provider how much calcium you should have. General instructions  Get plenty of rest.  Most people can resume normal activities 1-2 days after the procedure. Ask your health  care provider what activities are safe for you.  Your health care provider may direct you to lie in a certain position (postural drainage) and tap firmly (percuss) over your kidney area to help stone fragments pass. Follow instructions as told by your health care provider.  If directed, strain all urine through the strainer that was provided by your health care provider. ? Keep all fragments for your health care provider to see. Any stones that are found may be sent to a medical lab for examination. The stone may be as small as a grain of salt.  Keep all follow-up visits as told by your health care  provider. This is important. Contact a health care provider if:  You have pain that is severe or does not get better with medicine.  You have nausea that is severe or does not go away.  You have blood in your urine longer than your health care provider told you to expect.  You have more blood in your urine.  You have pain during urination that does not go away.  You urinate more frequently than usual and this does not go away.  You develop a rash or any other possible signs of an allergic reaction. Get help right away if:  You have severe pain in your back, sides, or upper abdomen.  You have severe pain while urinating.  Your urine is very dark red.  You have blood in your stool (feces).  You cannot pass any urine at all.  You feel a strong urge to urinate after emptying your bladder.  You have a fever or chills.  You develop shortness of breath, difficulty breathing, or chest pain.  You have severe nausea that leads to persistent vomiting.  You faint. Summary  After this procedure, it is common to have some pain, discomfort, or nausea when pieces (fragments) of the kidney stone move through the tube that carries urine from the kidney to the bladder (ureter). If this pain or nausea is severe, however, you should contact your health care provider.  Most people can resume normal activities 1-2 days after the procedure. Ask your health care provider what activities are safe for you.  Drink enough water and fluids to keep your urine clear or pale yellow. This helps any remaining pieces of the stone to pass, and it can help prevent new stones from forming.  If directed, strain your urine and keep all fragments for your health care provider to see. Fragments or stones may be as small as a grain of salt.  Get help right away if you have severe pain in your back, sides, or upper abdomen or have severe pain while urinating. This information is not intended to replace advice  given to you by your health care provider. Make sure you discuss any questions you have with your health care provider. Document Revised: 09/11/2018 Document Reviewed: 04/21/2016 Elsevier Patient Education  2020 [employer].

## 2020-05-12 ENCOUNTER — Encounter (HOSPITAL_COMMUNITY)
Admission: RE | Admit: 2020-05-12 | Discharge: 2020-05-12 | Disposition: A | Discharge status: Home / Self Care | Payer: 59 | Source: Ambulatory Visit | Attending: Urology | Admitting: Urology

## 2020-05-12 ENCOUNTER — Other Ambulatory Visit (HOSPITAL_COMMUNITY)
Admission: RE | Admit: 2020-05-12 | Discharge: 2020-05-12 | Disposition: A | Discharge status: Home / Self Care | Payer: 59 | Source: Ambulatory Visit | Attending: Urology | Admitting: Urology

## 2020-05-12 ENCOUNTER — Other Ambulatory Visit: Payer: Self-pay

## 2020-05-12 DIAGNOSIS — Z01812 Encounter for preprocedural laboratory examination: Secondary | ICD-10-CM | POA: Insufficient documentation

## 2020-05-12 DIAGNOSIS — Z20822 Contact with and (suspected) exposure to covid-19: Secondary | ICD-10-CM | POA: Insufficient documentation

## 2020-05-12 LAB — SARS CORONAVIRUS 2 (TAT 6-24 HRS): SARS Coronavirus 2: NEGATIVE

## 2020-05-13 ENCOUNTER — Ambulatory Visit (HOSPITAL_COMMUNITY)
Admission: RE | Admit: 2020-05-13 | Discharge: 2020-05-13 | Disposition: A | Discharge status: Home / Self Care | Payer: 59 | Attending: Urology | Admitting: Urology

## 2020-05-13 ENCOUNTER — Encounter (HOSPITAL_COMMUNITY)
Admission: RE | Disposition: A | Discharge status: Home / Self Care | Payer: Self-pay | Source: Home / Self Care | Attending: Urology

## 2020-05-13 ENCOUNTER — Encounter (HOSPITAL_COMMUNITY): Payer: Self-pay | Admitting: Urology

## 2020-05-13 ENCOUNTER — Ambulatory Visit (HOSPITAL_COMMUNITY): Payer: 59

## 2020-05-13 DIAGNOSIS — N201 Calculus of ureter: Secondary | ICD-10-CM | POA: Insufficient documentation

## 2020-05-13 DIAGNOSIS — Z87442 Personal history of urinary calculi: Secondary | ICD-10-CM | POA: Insufficient documentation

## 2020-05-13 DIAGNOSIS — N200 Calculus of kidney: Secondary | ICD-10-CM

## 2020-05-13 DIAGNOSIS — Z841 Family history of disorders of kidney and ureter: Secondary | ICD-10-CM | POA: Insufficient documentation

## 2020-05-13 HISTORY — PX: EXTRACORPOREAL SHOCK WAVE LITHOTRIPSY: SHX1557

## 2020-05-13 SURGERY — LITHOTRIPSY, ESWL
Anesthesia: LOCAL | Laterality: Right

## 2020-05-13 MED ORDER — TAMSULOSIN HCL 0.4 MG PO CAPS: 0.4000 mg | ORAL_CAPSULE | Freq: Every day | ORAL | 0 refills | Status: DC

## 2020-05-13 MED ORDER — SODIUM CHLORIDE 0.9 % IV SOLN: INTRAVENOUS | Status: DC

## 2020-05-13 MED ORDER — DIPHENHYDRAMINE HCL 25 MG PO CAPS
25.0000 mg | ORAL_CAPSULE | ORAL | Status: AC
  Administered 2020-05-13: 25 mg via ORAL
  Filled 2020-05-13: qty 1

## 2020-05-13 MED ORDER — DIAZEPAM 5 MG PO TABS
10.0000 mg | ORAL_TABLET | Freq: Once | ORAL | Status: AC
  Administered 2020-05-13: 10 mg via ORAL
  Filled 2020-05-13: qty 2

## 2020-05-13 MED ORDER — OXYCODONE-ACETAMINOPHEN 5-325 MG PO TABS: 1.0000 | ORAL_TABLET | ORAL | 0 refills | Status: DC | PRN

## 2020-05-13 MED ORDER — ONDANSETRON 4 MG PO TBDP: ORAL_TABLET | ORAL | 0 refills | Status: DC

## 2020-05-13 NOTE — H&P (Signed)
Urology Admission H&P  Chief Complaint: right flank pain  History of Present Illness:   [name] is a 49yo here for treatment of his right ureteral calculus.  His records from [facility] are as follows: I have ureteral stone.  HPI: Stephen Daugherty is a 49 year-old male patient who is here for ureteral stone.  The problem is on the right side. He first stated noticing pain on 04/28/2020. Pain is occuring on the right side.   N[place] is a former patient who was last seen in 2017. he has a history of stones and had the onset last Monday morning of right flank pain. He had hematuria on that Monday morning. The blood has cleared and has had no pain since Thursday. He was seen at Urgent Care on Tuesday and was given an antibiotic. He saw Dr. Gerarda Fraction later in the week and was given toradol. He had a CT stone study on 11/18 and was found to have a 5x16m right proximal stone with obstruction. The stone was adjacent to L3. He has no voiding complaints or fever. His UA today has 20-40 RBC's. His prior stones were calcium. He has a history of a urethral stricture that has required dilation in the past but he isn't having a reduced stream.      ALLERGIES: No Allergies    MEDICATIONS: Ketorolac Tromethamine  Lisinopril  Abx     GU PSH: Cysto Dilate Stricture (M or F) - 2017 Cystoscopy - 2017 Remove Kidney Stone - 2017 Subsequent Male VB Sounds - 2017       PSH Notes: Lithotomy, Dilation Of Male Urethral Stricture   NON-GU PSH: Remove Spleen; Partial     GU PMH: Bulbar urethral stricture (Stable), He had a moderate stricture but this was easily dilated today. - 2017 ED due to arterial insufficiency (Stable) - 2017 Post-Trauma urethral stricture, male, meatal - 2017 Urinary Calculus, Unspec - 2017 Meatal stenosis (other urethral stricture), Other specified causes of urethral stricture - 2017 Renal calculus, Nephrolithiasis - 2017 Ureteral Stone, Urethral colic due to calculus - 2017    NON-GU PMH:  Personal history of other diseases of the circulatory system, History of hypertension - 2017 Encounter for general adult medical examination without abnormal findings, Encounter for preventive health examination Hypertension    FAMILY HISTORY: 1 Daughter - Other Hematuria - Runs In Family Recurrent nephrolithiasis - Runs In Family   SOCIAL HISTORY: Marital Status: Single Preferred Language: English; Race: White Current Smoking Status: Patient has never smoked.   Tobacco Use Assessment Completed: Used Tobacco in last 30 days? Drinks 3 caffeinated drinks per day.     Notes: Occupation, Alcohol use, Single, Tobacco use, Caffeine use   REVIEW OF SYSTEMS:    GU Review Male:   Patient reports get up at night to urinate. Patient denies frequent urination, hard to postpone urination, burning/ pain with urination, leakage of urine, stream starts and stops, trouble starting your stream, have to strain to urinate , erection problems, and penile pain.  Gastrointestinal (Upper):   Patient reports nausea. Patient denies vomiting and indigestion/ heartburn.  Gastrointestinal (Lower):   Patient denies diarrhea and constipation.  Constitutional:   Patient reports fever. Patient denies night sweats, weight loss, and fatigue.  Skin:   Patient denies skin rash/ lesion and itching.  Eyes:   Patient denies blurred vision and double vision.  Ears/ Nose/ Throat:   Patient denies sore throat and sinus problems.  Hematologic/Lymphatic:   Patient denies swollen glands and easy bruising.  Cardiovascular:   Patient denies leg swelling and chest pains.  Respiratory:   Patient denies cough and shortness of breath.  Endocrine:   Patient denies excessive thirst.  Musculoskeletal:   Patient denies back pain and joint pain.  Neurological:   Patient reports dizziness. Patient denies headaches.  Psychologic:   Patient denies depression and anxiety.   VITAL SIGNS:      05/05/2020 02:47 PM  Weight 150 lb / 68.04 kg   Height 67 in / 170.18 cm  BP 138/94 mmHg  Heart Rate 80 /min  Temperature 99.6 F / 37.5 C  BMI 23.5 kg/m2   MULTI-SYSTEM PHYSICAL EXAMINATION:    Constitutional: Well-nourished. No physical deformities. Normally developed. Good grooming.  Neck: Neck symmetrical, not swollen. Normal tracheal position.  Respiratory: Normal breath sounds. No labored breathing, no use of accessory muscles.   Cardiovascular: Regular rate and rhythm. No murmur, no gallop.   Lymphatic: No enlargement of neck, axillae, groin.  Skin: No paleness, no jaundice, no cyanosis. No lesion, no ulcer, no rash.  Neurologic / Psychiatric: Oriented to time, oriented to place, oriented to person. No depression, no anxiety, no agitation.  Gastrointestinal: No mass, no tenderness, no rigidity, non obese abdomen.  Musculoskeletal: Normal gait and station of head and neck.     Complexity of Data:  Records Review:   Previous Patient Records  Urine Test Review:   Urinalysis  X-Ray Review: C.T. Stone Protocol: Reviewed Films. Reviewed Report. Discussed With Patient.     PROCEDURES:         KUB - [id number]  A single view of the abdomen is obtained. There is a 5x50m ovoid calcification on the right between L2 and L3 that is consistent with the stone on the CT. There are small left renal stones with a 3-446mLLP stone. He has prostatic stones and pelvic phleboliths. No bone, gas or soft tissue abnormalities are noted.       . Patient confirmed No Neulasta OnPro Device.           Urinalysis w/Scope Dipstick Dipstick Cont'd Micro  Color: Yellow Bilirubin: Neg mg/dL WBC/hpf: 0 - 5/hpf  Appearance: Clear Ketones: Neg mg/dL RBC/hpf: 20 - 40/hpf  Specific Gravity: 1.020 Blood: 3+ ery/uL Bacteria: NS (Not Seen)  pH: 6.0 Protein: Neg mg/dL Cystals: NS (Not Seen)  Glucose: Neg mg/dL Urobilinogen: 0.2 mg/dL Casts: Hyaline    Nitrites: Neg Trichomonas: Not Present    Leukocyte Esterase: Trace leu/uL Mucous: Present      Epithelial  Cells: NS (Not Seen)      Yeast: NS (Not Seen)      Sperm: Not Present    ASSESSMENT:      ICD-10 Details  1 GU:   Ureteral calculus - N20.1 Acute, Systemic Symptoms - The right ureteral stone has not progressed. I discussed treatment options including MET, URS and ESWL and will get him set up for ESWL next week. I reviewed the risks of ESWL including bleeding, infection, injury to the kidney or adjacent structures, failure to fragment the stone, need for ancillary procedures, thrombotic events, cardiac arrhythmias and sedation complications. I have sent a script for pain med as well.   2   Renal calculus - N20.0 Chronic, Stable - He has left renal stones and a metabolic w/u might be worthwhile in the future.      Past Medical History:  Diagnosis Date  . Kidney stone    Past Surgical History:  Procedure Laterality Date  . SPLENECTOMY, TOTAL    .  URETHRAL DILATION      Home Medications:  Current Facility-Administered Medications  Medication Dose Route Frequency Provider Last Rate Last Admin  . 0.9 %  sodium chloride infusion   Intravenous Continuous Cleon Gustin, MD 10 mL/hr at 05/13/20 0754 New Bag at 05/13/20 0754   Allergies: No Known Allergies  History reviewed. No pertinent family history. Social History:  reports that he has never smoked. He quit smokeless tobacco use about 3 years ago.  His smokeless tobacco use included chew. He reports that he does not drink alcohol and does not use drugs.  Review of Systems  Physical Exam:  Vital signs in last 24 hours: Temp:  [98.5 F (36.9 C)] 98.5 F (36.9 C) (11/30 0734) Pulse Rate:  [73] 73 (11/30 0734) Resp:  [12] 12 (11/30 0734) BP: (141)/(88) 141/88 (11/30 0734) SpO2:  [97 %] 97 % (11/30 0734) Weight:  [68 kg] 68 kg (11/30 0734) Physical Exam  Laboratory Data:  Results for orders placed or performed during the hospital encounter of 05/12/20 (from the past 24 hour(s))  SARS CORONAVIRUS 2 (TAT 6-24 HRS)  Nasopharyngeal Nasopharyngeal Swab     Status: None   Collection Time: 05/12/20  9:05 AM   Specimen: Nasopharyngeal Swab  Result Value Ref Range   SARS Coronavirus 2 NEGATIVE NEGATIVE   Recent Results (from the past 240 hour(s))  SARS CORONAVIRUS 2 (TAT 6-24 HRS) Nasopharyngeal Nasopharyngeal Swab     Status: None   Collection Time: 05/12/20  9:05 AM   Specimen: Nasopharyngeal Swab  Result Value Ref Range Status   SARS Coronavirus 2 NEGATIVE NEGATIVE Final    Comment: (NOTE) SARS-CoV-2 target nucleic acids are NOT DETECTED.  The SARS-CoV-2 RNA is generally detectable in upper and lower respiratory specimens during the acute phase of infection. Negative results do not preclude SARS-CoV-2 infection, do not rule out co-infections with other pathogens, and should not be used as the sole basis for treatment or other patient management decisions. Negative results must be combined with clinical observations, patient history, and epidemiological information. The expected result is Negative.  Fact Sheet for Patients: SugarRoll.be  Fact Sheet for Healthcare Providers: https://www.woods-mathews.com/  This test is not yet approved or cleared by the [place] FDA and  has been authorized for detection and/or diagnosis of SARS-CoV-2 by FDA under an Emergency Use Authorization (EUA). This EUA will remain  in effect (meaning this test can be used) for the duration of the COVID-19 declaration under Se ction 564(b)(1) of the Act, 21 U.S.C. section 360bbb-3(b)(1), unless the authorization is terminated or revoked sooner.  Performed at [facility], [place] [address] [place], [place] [address]    Creatinine: No results for input(s): CREATININE in the last 168 hours. Baseline Creatinine:   Impression/Assessment:  49yo with a right ureteral calculus  Plan:  -We discussed the management of kidney stones. These options include observation,  ureteroscopy, shockwave lithotripsy (ESWL) and percutaneous nephrolithotomy (PCNL). We discussed which options are relevant to the patient's stone(s). We discussed the natural history of kidney stones as well as the complications of untreated stones and the impact on quality of life without treatment as well as with each of the above listed treatments. We also discussed the efficacy of each treatment in its ability to clear the stone burden. With any of these management options I discussed the signs and symptoms of infection and the need for emergent treatment should these be experienced. For each option we discussed the ability of each procedure  to clear the patient of their stone burden.   For observation I described the risks which include but are not limited to silent renal damage, life-threatening infection, need for emergent surgery, failure to pass stone and pain.   For ureteroscopy I described the risks which include bleeding, infection, damage to contiguous structures, positioning injury, ureteral stricture, ureteral avulsion, ureteral injury, need for prolonged ureteral stent, inability to perform ureteroscopy, need for an interval procedure, inability to clear stone burden, stent discomfort/pain, heart attack, stroke, pulmonary embolus and the inherent risks with general anesthesia.   For shockwave lithotripsy I described the risks which include arrhythmia, kidney contusion, kidney hemorrhage, need for transfusion, pain, inability to adequately break up stone, inability to pass stone fragments, Steinstrasse, infection associated with obstructing stones, need for alternate surgical procedure, need for repeat shockwave lithotripsy, MI, CVA, PE and the inherent risks with anesthesia/conscious sedation.   For PCNL I described the risks including positioning injury, pneumothorax, hydrothorax, need for chest tube, inability to clear stone burden, renal laceration, arterial venous fistula or  malformation, need for embolization of kidney, loss of kidney or renal function, need for repeat procedure, need for prolonged nephrostomy tube, ureteral avulsion, MI, CVA, PE and the inherent risks of general anesthesia.   - The patient would like to proceed with Right ESWL  Nicolette Bang 05/13/2020, 8:30 AM

## 2020-05-13 NOTE — Progress Notes (Signed)
Post procedure  Redness noted to right flank area.

## 2020-05-13 NOTE — Discharge Instructions (Signed)
Lithotripsy, Care After °This sheet gives you information about how to care for yourself after your procedure. Your health care provider may also give you more specific instructions. If you have problems or questions, contact your health care provider. °What can I expect after the procedure? °After the procedure, it is common to have: °· Some blood in your urine. This should only last for a few days. °· Soreness in your back, sides, or upper abdomen for a few days. °· Blotches or bruises on your back where the pressure wave entered the skin. °· Pain, discomfort, or nausea when pieces (fragments) of the kidney stone move through the tube that carries urine from the kidney to the bladder (ureter). Stone fragments may pass soon after the procedure, but they may continue to pass for up to 4-8 weeks. °? If you have severe pain or nausea, contact your health care provider. This may be caused by a large stone that was not broken up, and this may mean that you need more treatment. °· Some pain or discomfort during urination. °· Some pain or discomfort in the lower abdomen or (in men) at the base of the penis. °Follow these instructions at home: °Medicines °· Take over-the-counter and prescription medicines only as told by your health care provider. °· If you were prescribed an antibiotic medicine, take it as told by your health care provider. Do not stop taking the antibiotic even if you start to feel better. °· Do not drive for 24 hours if you were given a medicine to help you relax (sedative). °· Do not drive or use heavy machinery while taking prescription pain medicine. °Eating and drinking ° °  ° °· Drink enough water and fluids to keep your urine clear or pale yellow. This helps any remaining pieces of the stone to pass. It can also help prevent new stones from forming. °· Eat plenty of fresh fruits and vegetables. °· Follow instructions from your health care provider about eating and drinking restrictions. You may be  instructed: °? To reduce how much salt (sodium) you eat or drink. Check ingredients and nutrition facts on packaged foods and beverages. °? To reduce how much meat you eat. °· Eat the recommended amount of calcium for your age and gender. Ask your health care provider how much calcium you should have. °General instructions °· Get plenty of rest. °· Most people can resume normal activities 1-2 days after the procedure. Ask your health care provider what activities are safe for you. °· Your health care provider may direct you to lie in a certain position (postural drainage) and tap firmly (percuss) over your kidney area to help stone fragments pass. Follow instructions as told by your health care provider. °· If directed, strain all urine through the strainer that was provided by your health care provider. °? Keep all fragments for your health care provider to see. Any stones that are found may be sent to a medical lab for examination. The stone may be as small as a grain of salt. °· Keep all follow-up visits as told by your health care provider. This is important. °Contact a health care provider if: °· You have pain that is severe or does not get better with medicine. °· You have nausea that is severe or does not go away. °· You have blood in your urine longer than your health care provider told you to expect. °· You have more blood in your urine. °· You have pain during urination that does   not go away.  You urinate more frequently than usual and this does not go away.  You develop a rash or any other possible signs of an allergic reaction. Get help right away if:  You have severe pain in your back, sides, or upper abdomen.  You have severe pain while urinating.  Your urine is very dark red.  You have blood in your stool (feces).  You cannot pass any urine at all.  You feel a strong urge to urinate after emptying your bladder.  You have a fever or chills.  You develop shortness of breath,  difficulty breathing, or chest pain.  You have severe nausea that leads to persistent vomiting.  You faint. Summary  After this procedure, it is common to have some pain, discomfort, or nausea when pieces (fragments) of the kidney stone move through the tube that carries urine from the kidney to the bladder (ureter). If this pain or nausea is severe, however, you should contact your health care provider.  Most people can resume normal activities 1-2 days after the procedure. Ask your health care provider what activities are safe for you.  Drink enough water and fluids to keep your urine clear or pale yellow. This helps any remaining pieces of the stone to pass, and it can help prevent new stones from forming.  If directed, strain your urine and keep all fragments for your health care provider to see. Fragments or stones may be as small as a grain of salt.  Get help right away if you have severe pain in your back, sides, or upper abdomen or have severe pain while urinating. This information is not intended to replace advice given to you by your health care provider. Make sure you discuss any questions you have with your health care provider. Document Revised: 09/11/2018 Document Reviewed: 04/21/2016 Elsevier Patient Education  2020 [employer] Inc[employer]   Monitored Anesthesia Care Anesthesia is a term that refers to techniques, procedures, and medicines that help a person stay safe and comfortable during a medical procedure. Monitored anesthesia care, or sedation, is one type of anesthesia. Your anesthesia specialist may recommend sedation if you will be having a procedure that does not require you to be unconscious, such as:  Cataract surgery.  A dental procedure.  A biopsy.  A colonoscopy. During the procedure, you may receive a medicine to help you relax (sedative). There are three levels of sedation:  Mild sedation. At this level, you may feel awake and relaxed. You will be able to  follow directions.  Moderate sedation. At this level, you will be sleepy. You may not remember the procedure.  Deep sedation. At this level, you will be asleep. You will not remember the procedure. The more medicine you are given, the deeper your level of sedation will be. Depending on how you respond to the procedure, the anesthesia specialist may change your level of sedation or the type of anesthesia to fit your needs. An anesthesia specialist will monitor you closely during the procedure. Let your health care provider know about:  Any allergies you have.  All medicines you are taking, including vitamins, herbs, eye drops, creams, and over-the-counter medicines.  Any use of steroids (by mouth or as a cream).  Any problems you or family members have had with sedatives and anesthetic medicines.  Any blood disorders you have.  Any surgeries you have had.  Any medical conditions you have, such as sleep apnea.  Whether you are pregnant or may be pregnant.  Any use of cigarettes, alcohol, or street drugs. What are the risks? Generally, this is a safe procedure. However, problems may occur, including:  Getting too much medicine (oversedation).  Nausea.  Allergic reaction to medicines.  Trouble breathing. If this happens, a breathing tube may be used to help with breathing. It will be removed when you are awake and breathing on your own.  Heart trouble.  Lung trouble. Before the procedure Staying hydrated Follow instructions from your health care provider about hydration, which may include:  Up to 2 hours before the procedure - you may continue to drink clear liquids, such as water, clear fruit juice, black coffee, and plain tea. Eating and drinking restrictions Follow instructions from your health care provider about eating and drinking, which may include:  8 hours before the procedure - stop eating heavy meals or foods such as meat, fried foods, or fatty foods.  6 hours  before the procedure - stop eating light meals or foods, such as toast or cereal.  6 hours before the procedure - stop drinking milk or drinks that contain milk.  2 hours before the procedure - stop drinking clear liquids. Medicines Ask your health care provider about:  Changing or stopping your regular medicines. This is especially important if you are taking diabetes medicines or blood thinners.  Taking medicines such as aspirin and ibuprofen. These medicines can thin your blood. Do not take these medicines before your procedure if your health care provider instructs you not to. Tests and exams  You will have a physical exam.  You may have blood tests done to show: ? How well your kidneys and liver are working. ? How well your blood can clot. General instructions  Plan to have someone take you home from the hospital or clinic.  If you will be going home right after the procedure, plan to have someone with you for 24 hours.  What happens during the procedure?  Your blood pressure, heart rate, breathing, level of pain and overall condition will be monitored.  An IV tube will be inserted into one of your veins.  Your anesthesia specialist will give you medicines as needed to keep you comfortable during the procedure. This may mean changing the level of sedation.  The procedure will be performed. After the procedure  Your blood pressure, heart rate, breathing rate, and blood oxygen level will be monitored until the medicines you were given have worn off.  Do not drive for 24 hours if you received a sedative.  You may: ? Feel sleepy, clumsy, or nauseous. ? Feel forgetful about what happened after the procedure. ? Have a sore throat if you had a breathing tube during the procedure. ? Vomit. This information is not intended to replace advice given to you by your health care provider. Make sure you discuss any questions you have with your health care provider. Document Revised:  05/13/2017 Document Reviewed: 09/21/2015 Elsevier Patient Education  2020 [employer].

## 2020-05-14 ENCOUNTER — Encounter (HOSPITAL_COMMUNITY): Payer: Self-pay | Admitting: Urology

## 2020-05-28 ENCOUNTER — Ambulatory Visit (HOSPITAL_COMMUNITY): Payer: Managed Care, Other (non HMO)

## 2020-05-28 NOTE — Progress Notes (Signed)
Subjective:  1. Ureteral stone      05/30/20: Stephen Daugherty returns today in f/u from [facility] on 05/13/20 for a right proximal stone.   He has done well and passed several fragments.  He has no pain or hematuria.   He has small bilateral renal stones that were not treated  Stephen Daugherty is a former patient who was last seen in 2017. he has a history of stones and had the onset last Monday morning of right flank pain. He had hematuria on that Monday morning. The blood has cleared and has had no pain since Thursday. He was seen at Urgent Care on Tuesday and was given an antibiotic. He saw Dr. Sherwood Gambler later in the week and was given toradol. He had a CT stone study on 11/18 and was found to have a 5x51mm right proximal stone with obstruction. The stone was adjacent to L3. He has no voiding complaints or fever. His UA today has 20-40 RBC's. His prior stones were calcium. He has a history of a urethral stricture that has required dilation in the past but he isn't having a reduced stream.       ROS:  ROS:  A complete review of systems was performed.  All systems are negative except for pertinent findings as noted.   ROS  No Known Allergies  Outpatient Encounter Medications as of 05/30/2020  Medication Sig   lisinopril (ZESTRIL) 10 MG tablet Take 1 tablet (10 mg total) by mouth daily.   Multiple Vitamin (MULTIVITAMIN WITH MINERALS) TABS tablet Take 1 tablet by mouth daily.   ondansetron (ZOFRAN ODT) 4 MG disintegrating tablet 4mg  ODT q4 hours prn nausea/vomit   oxyCODONE-acetaminophen (PERCOCET) 5-325 MG tablet Take 1 tablet by mouth every 4 (four) hours as needed for severe pain.   tamsulosin (FLOMAX) 0.4 MG CAPS capsule Take 1 capsule (0.4 mg total) by mouth daily after supper.   No facility-administered encounter medications on file as of 05/30/2020.    Past Medical History:  Diagnosis Date   Kidney stone     Past Surgical History:  Procedure Laterality Date   EXTRACORPOREAL SHOCK WAVE  LITHOTRIPSY Right 05/13/2020   Procedure: EXTRACORPOREAL SHOCK WAVE LITHOTRIPSY (ESWL);  Surgeon: 05/15/2020, MD;  Location: [facility];  Service: Urology;  Laterality: Right;   SPLENECTOMY, TOTAL     URETHRAL DILATION      Social History   Socioeconomic History   Marital status: Single    Spouse name: Not on file   Number of children: Not on file   Years of education: Not on file   Highest education level: Not on file  Occupational History   Not on file  Tobacco Use   Smoking status: Never Smoker   Smokeless tobacco: Former [place] Gauze    Types: Chew  Substance and Sexual Activity   Alcohol use: No   Drug use: No   Sexual activity: Not on file  Other Topics Concern   Not on file  Social History Narrative   Not on file   Social Determinants of Health   Financial Resource Strain: Not on file  Food Insecurity: Not on file  Transportation Needs: Not on file  Physical Activity: Not on file  Stress: Not on file  Social Connections: Not on file  Intimate Partner Violence: Not on file    No family history on file.     Objective: Vitals:   05/30/20 1306  BP: (!) 150/92  Pulse: 74  Temp: 98.4 F (36.9 C)  Physical Exam  Lab Results:  PSA No results found for: PSA No results found for: TESTOSTERONE    Studies/Results: No results found.    Assessment & Plan: Right ureteral stone.  He is doing well s/p [facility]SW[facility].  I will get the stone analyzed and a KUB today.  Bilateral renal stones.  KUB in 6 months.    No orders of the defined types were placed in this encounter.    Orders Placed This Encounter  Procedures   DG Abd 1 View    Standing Status:   Future    Standing Expiration Date:   06/30/2020    Order Specific Question:   Reason for Exam (SYMPTOM  OR DIAGNOSIS REQUIRED)    Answer:   ureteral stone    Order Specific Question:   Preferred imaging location?    Answer:   [facility]    Order Specific Question:   Radiology  Contrast Protocol - do NOT remove file path    Answer:   [email or web address]   DG Abd 1 View    Standing Status:   Future    Standing Expiration Date:   05/30/2021    Order Specific Question:   Reason for Exam (SYMPTOM  OR DIAGNOSIS REQUIRED)    Answer:   renal stone    Order Specific Question:   Preferred imaging location?    Answer:   [facility]    Order Specific Question:   Radiology Contrast Protocol - do NOT remove file path    Answer:   [email or web address]   Calculi, with Photograph (to Clinical Lab)   Urinalysis, Routine w reflex microscopic      Return in about 6 months (around 11/28/2020) for with KUB.   CC: Assunta Found, MD      Bjorn Pippin 05/31/2020

## 2020-05-30 ENCOUNTER — Other Ambulatory Visit: Payer: Self-pay

## 2020-05-30 ENCOUNTER — Ambulatory Visit (INDEPENDENT_AMBULATORY_CARE_PROVIDER_SITE_OTHER): Payer: 59 | Admitting: Urology

## 2020-05-30 VITALS — BP 150/92 | HR 74 | Temp 98.4°F | Ht 67.0 in | Wt 150.0 lb

## 2020-05-30 DIAGNOSIS — N201 Calculus of ureter: Secondary | ICD-10-CM

## 2020-05-30 LAB — URINALYSIS, ROUTINE W REFLEX MICROSCOPIC
Bilirubin, UA: NEGATIVE
Glucose, UA: NEGATIVE
Ketones, UA: NEGATIVE
Leukocytes,UA: NEGATIVE
Nitrite, UA: NEGATIVE
Protein,UA: NEGATIVE
RBC, UA: NEGATIVE
Specific Gravity, UA: 1.025 (ref 1.005–1.030)
Urobilinogen, Ur: 0.2 mg/dL (ref 0.2–1.0)
pH, UA: 6.5 (ref 5.0–7.5)

## 2020-05-30 NOTE — Progress Notes (Signed)
Urological Symptom Review  Patient is experiencing the following symptoms: Kidney stones   Review of Systems  Gastrointestinal (upper)  : Negative for upper GI symptoms  Gastrointestinal (lower) : Negative for lower GI symptoms  Constitutional : Negative for symptoms  Skin: Negative for skin symptoms  Eyes: Negative for eye symptoms  Ear/Nose/Throat : Negative for Ear/Nose/Throat symptoms  Hematologic/Lymphatic: Negative for Hematologic/Lymphatic symptoms  Cardiovascular : Negative for cardiovascular symptoms  Respiratory : Negative for respiratory symptoms  Endocrine: Negative for endocrine symptoms  Musculoskeletal: Negative for musculoskeletal symptoms  Neurological: Negative for neurological symptoms  Psychologic: Negative for psychiatric symptoms  

## 2020-06-04 LAB — CALCULI, WITH PHOTOGRAPH (CLINICAL LAB)
Calcium Oxalate Dihydrate: 90 %
Calcium Oxalate Monohydrate: 10 %
Weight Calculi: 36 mg

## 2020-06-05 ENCOUNTER — Other Ambulatory Visit: Payer: Self-pay

## 2020-06-05 ENCOUNTER — Ambulatory Visit (HOSPITAL_COMMUNITY)
Admission: RE | Admit: 2020-06-05 | Discharge: 2020-06-05 | Disposition: A | Discharge status: Home / Self Care | Payer: 59 | Source: Ambulatory Visit | Attending: Urology | Admitting: Urology

## 2020-06-05 DIAGNOSIS — N201 Calculus of ureter: Secondary | ICD-10-CM | POA: Diagnosis present

## 2020-06-10 ENCOUNTER — Telehealth: Payer: Self-pay

## 2020-06-10 NOTE — Telephone Encounter (Signed)
Message left of results and to return call if any questions. Per Dr. Annabell Howells- negative KUB. All stones has cleared.

## 2020-06-11 ENCOUNTER — Telehealth: Payer: Self-pay

## 2020-06-11 NOTE — Progress Notes (Signed)
Letter mailed along with a low oxalate diet pamphlet.

## 2020-06-11 NOTE — Telephone Encounter (Signed)
Attempted to call pt to give type of kidney stone. Lft message.

## 2020-07-10 NOTE — Telephone Encounter (Signed)
Pt notified per message .

## 2020-12-04 ENCOUNTER — Encounter: Payer: Self-pay | Admitting: Urology

## 2020-12-04 ENCOUNTER — Other Ambulatory Visit: Payer: Self-pay

## 2020-12-04 ENCOUNTER — Ambulatory Visit: Payer: 59 | Admitting: Urology

## 2020-12-04 ENCOUNTER — Ambulatory Visit (HOSPITAL_COMMUNITY)
Admission: RE | Admit: 2020-12-04 | Discharge: 2020-12-04 | Disposition: A | Discharge status: Home / Self Care | Payer: 59 | Source: Ambulatory Visit | Attending: Urology | Admitting: Urology

## 2020-12-04 VITALS — BP 141/90 | HR 80 | Temp 98.6°F

## 2020-12-04 DIAGNOSIS — R3129 Other microscopic hematuria: Secondary | ICD-10-CM

## 2020-12-04 DIAGNOSIS — N200 Calculus of kidney: Secondary | ICD-10-CM | POA: Diagnosis not present

## 2020-12-04 LAB — URINALYSIS, ROUTINE W REFLEX MICROSCOPIC
Bilirubin, UA: NEGATIVE
Glucose, UA: NEGATIVE
Ketones, UA: NEGATIVE
Leukocytes,UA: NEGATIVE
Nitrite, UA: NEGATIVE
Protein,UA: NEGATIVE
Specific Gravity, UA: 1.02 (ref 1.005–1.030)
Urobilinogen, Ur: 0.2 mg/dL (ref 0.2–1.0)
pH, UA: 7 (ref 5.0–7.5)

## 2020-12-04 LAB — MICROSCOPIC EXAMINATION
Bacteria, UA: NONE SEEN
Epithelial Cells (non renal): NONE SEEN /hpf (ref 0–10)
Renal Epithel, UA: NONE SEEN /hpf
WBC, UA: NONE SEEN /hpf (ref 0–5)

## 2020-12-04 NOTE — Progress Notes (Signed)
Subjective:  1. Bilateral renal stones   2. Microhematuria     12/04/20: Stephen Daugherty returns today in f/u.  He has done well since the last visit and has had no further fragments pass.  He has had no hematuria or flank pain.  His UA today has 3-10 RBC's.  His stone was calcium oxalate.   05/30/20: Stephen Daugherty returns today in f/u from [facility] on 05/13/20 for a right proximal stone.   He has done well and passed several fragments.  He has no pain or hematuria.   He has small bilateral renal stones that were not treated  Stephen Daugherty is a former patient who was last seen in 2017. he has a history of stones and had the onset last Monday morning of right flank pain. He had hematuria on that Monday morning. The blood has cleared and has had no pain since Thursday. He was seen at Urgent Care on Tuesday and was given an antibiotic. He saw Dr. Sherwood Gambler later in the week and was given toradol. He had a CT stone study on 11/18 and was found to have a 5x91mm right proximal stone with obstruction. The stone was adjacent to L3. He has no voiding complaints or fever. His UA today has 20-40 RBC's. His prior stones were calcium. He has a history of a urethral stricture that has required dilation in the past but he isn't having a reduced stream.       ROS:  ROS:  A complete review of systems was performed.  All systems are negative except for pertinent findings as noted.   ROS  No Known Allergies  Outpatient Encounter Medications as of 12/04/2020  Medication Sig   lisinopril (ZESTRIL) 10 MG tablet Take 1 tablet (10 mg total) by mouth daily.   Multiple Vitamin (MULTIVITAMIN WITH MINERALS) TABS tablet Take 1 tablet by mouth daily.   rosuvastatin (CRESTOR) 10 MG tablet Take 10 mg by mouth at bedtime.   [DISCONTINUED] ondansetron (ZOFRAN ODT) 4 MG disintegrating tablet 4mg  ODT q4 hours prn nausea/vomit   [DISCONTINUED] oxyCODONE-acetaminophen (PERCOCET) 5-325 MG tablet Take 1 tablet by mouth every 4 (four) hours as needed for  severe pain.   [DISCONTINUED] tamsulosin (FLOMAX) 0.4 MG CAPS capsule Take 1 capsule (0.4 mg total) by mouth daily after supper.   No facility-administered encounter medications on file as of 12/04/2020.    Past Medical History:  Diagnosis Date   Kidney stone     Past Surgical History:  Procedure Laterality Date   EXTRACORPOREAL SHOCK WAVE LITHOTRIPSY Right 05/13/2020   Procedure: EXTRACORPOREAL SHOCK WAVE LITHOTRIPSY (ESWL);  Surgeon: 05/15/2020, MD;  Location: [facility];  Service: Urology;  Laterality: Right;   SPLENECTOMY, TOTAL     URETHRAL DILATION      Social History   Socioeconomic History   Marital status: Single    Spouse name: Not on file   Number of children: Not on file   Years of education: Not on file   Highest education level: Not on file  Occupational History   Not on file  Tobacco Use   Smoking status: Never   Smokeless tobacco: Former    Types: Chew    Quit date: 01/10/2017  Substance and Sexual Activity   Alcohol use: No   Drug use: No   Sexual activity: Not on file  Other Topics Concern   Not on file  Social History Narrative   Not on file   Social Determinants of Health   Financial Resource Strain: Not  on file  Food Insecurity: Not on file  Transportation Needs: Not on file  Physical Activity: Not on file  Stress: Not on file  Social Connections: Not on file  Intimate Partner Violence: Not on file    History reviewed. No pertinent family history.     Objective: Vitals:   12/04/20 1007  BP: (!) 141/90  Pulse: 80  Temp: 98.6 F (37 C)     Physical Exam  Lab Results:  PSA No results found for: PSA No results found for: TESTOSTERONE Results for orders placed or performed in visit on 12/04/20 (from the past 24 hour(s))  Urinalysis, Routine w reflex microscopic     Status: Abnormal   Collection Time: 12/04/20 10:21 AM  Result Value Ref Range   Specific Gravity, UA 1.020 1.005 - 1.030   pH, UA 7.0 5.0 - 7.5   Color, UA  Amber (A) Yellow   Appearance Ur Clear Clear   Leukocytes,UA Negative Negative   Protein,UA Negative Negative/Trace   Glucose, UA Negative Negative   Ketones, UA Negative Negative   RBC, UA 1+ (A) Negative   Bilirubin, UA Negative Negative   Urobilinogen, Ur 0.2 0.2 - 1.0 mg/dL   Nitrite, UA Negative Negative   Microscopic Examination See below:    Narrative   Performed at:  [address] - [facility] [address] [place], [place]  [id number] Lab Director: Chinita Pester MT, Phone:  [phone]  Microscopic Examination     Status: Abnormal   Collection Time: 12/04/20 10:21 AM   Urine  Result Value Ref Range   WBC, UA None seen 0 - 5 /hpf   RBC 3-10 (A) 0 - 2 /hpf   Epithelial Cells (non renal) None seen 0 - 10 /hpf   Renal Epithel, UA None seen None seen /hpf   Mucus, UA Present Not Estab.   Bacteria, UA None seen None seen/Few   Narrative   Performed at:  [address] - [facility] [address] [place], [place]  [id number] Lab Director: Chinita Pester MT, Phone:  [phone]      Studies/Results: No results found.    Assessment & Plan: Bilateral renal stones.  The ureteral stone was CaOx.    KUB today and in a year.  I will notify him of the results.   Microhematuria.  He has 3-10 RBC's.   No orders of the defined types were placed in this encounter.    Orders Placed This Encounter  Procedures   Microscopic Examination   DG Abd 1 View    Standing Status:   Future    Standing Expiration Date:   01/03/2021    Order Specific Question:   Reason for Exam (SYMPTOM  OR DIAGNOSIS REQUIRED)    Answer:   renal stones    Order Specific Question:   Preferred imaging location?    Answer:   [facility]    Order Specific Question:   Radiology Contrast Protocol - do NOT remove file path    Answer:   [email or web address]   DG Abd 1 View    Standing Status:   Future    Standing Expiration Date:   12/04/2021     Order Specific Question:   Reason for Exam (SYMPTOM  OR DIAGNOSIS REQUIRED)    Answer:   renal stones    Order Specific Question:   Preferred imaging location?    Answer:   [facility]    Order Specific Question:   Radiology Contrast Protocol -  do NOT remove file path    Answer:   [email or web address]   Urinalysis, Routine w reflex microscopic      Return in about 1 year (around 12/04/2021) for with KUB.   CC: Assunta Found, MD      Stephen Daugherty 12/04/2020

## 2020-12-04 NOTE — Progress Notes (Signed)

## 2020-12-08 NOTE — Progress Notes (Signed)
Sent via mail 

## 2021-04-29 ENCOUNTER — Other Ambulatory Visit: Payer: Self-pay

## 2021-04-29 ENCOUNTER — Encounter: Payer: Self-pay | Admitting: Emergency Medicine

## 2021-04-29 ENCOUNTER — Ambulatory Visit
Admission: EM | Admit: 2021-04-29 | Discharge: 2021-04-29 | Disposition: A | Discharge status: Home / Self Care | Payer: 59 | Attending: Family Medicine | Admitting: Family Medicine

## 2021-04-29 DIAGNOSIS — M545 Low back pain, unspecified: Secondary | ICD-10-CM

## 2021-04-29 LAB — POCT URINALYSIS DIP (MANUAL ENTRY)
Bilirubin, UA: NEGATIVE
Blood, UA: NEGATIVE
Glucose, UA: NEGATIVE mg/dL
Ketones, POC UA: NEGATIVE mg/dL
Leukocytes, UA: NEGATIVE
Nitrite, UA: NEGATIVE
Protein Ur, POC: NEGATIVE mg/dL
Spec Grav, UA: 1.025 (ref 1.010–1.025)
Urobilinogen, UA: 1 E.U./dL
pH, UA: 7 (ref 5.0–8.0)

## 2021-04-29 MED ORDER — PREDNISONE 20 MG PO TABS: 40.0000 mg | ORAL_TABLET | Freq: Every day | ORAL | 0 refills | Status: DC

## 2021-04-29 MED ORDER — HYDROCODONE-ACETAMINOPHEN 5-325 MG PO TABS: 1.0000 | ORAL_TABLET | Freq: Four times a day (QID) | ORAL | 0 refills | Status: DC | PRN

## 2021-04-29 NOTE — ED Triage Notes (Signed)
Left lower back pain, started yesterday. Pain is so severe, he is nauseated.

## 2021-04-29 NOTE — Discharge Instructions (Addendum)
HOME CARE INSTRUCTIONS: For many people, back pain returns. Since low back pain is rarely dangerous, it is often a condition that people can learn to manage on their own. Please remain active. It is stressful on the back to sit or stand in one place. Do not sit, drive, or stand in one place for more than 30 minutes at a time. Take short walks on level surfaces as soon as pain allows. Try to increase the length of time you walk each day. Do not stay in bed. Resting more than 1 or 2 days can delay your recovery. Do not avoid exercise or work. Your body is made to move. It is not dangerous to be active, even though your back may hurt. Your back will likely heal faster if you return to being active before your pain is gone. Over-the-counter medicines to reduce pain and inflammation are often the most helpful.  SEEK MEDICAL CARE IF: You have pain that is not relieved with rest or medicine. You have pain that does not improve in 1 week. You have new symptoms. You are generally not feeling well.  SEEK IMMEDIATE MEDICAL CARE IF: You have pain that radiates from your back into your legs. You develop new bowel or bladder control problems. You have unusual weakness or numbness in your arms or legs. You develop nausea or vomiting. You develop abdominal pain. You feel faint.  Be aware, you have been prescribed pain medications that may cause drowsiness. While taking this medication, do not take any other medications containing acetaminophen (Tylenol). Do not combine with alcohol or other illicit drugs. Please do not drive, operate heavy machinery, or take part in activities that require making important decisions while on this medication as your judgement may be clouded.

## 2021-04-29 NOTE — ED Provider Notes (Signed)
[facility]   LC:[id number] 04/29/21 Arrival Time: [id number]  ASSESSMENT & PLAN:  1. Acute left-sided low back pain without sciatica    U/A normal; no hematuria.  Able to ambulate here and hemodynamically stable. No indication for imaging of back at this time given no trauma and normal neurological exam. Discussed.  Meds ordered this encounter  Medications   HYDROcodone-acetaminophen (NORCO/VICODIN) 5-325 MG tablet    Sig: Take 1 tablet by mouth every 6 (six) hours as needed for moderate pain or severe pain.    Dispense:  10 tablet    Refill:  0   predniSONE (DELTASONE) 20 MG tablet    Sig: Take 2 tablets (40 mg total) by mouth daily.    Dispense:  10 tablet    Refill:  0    Medication sedation precautions given. Encourage ROM/movement as tolerated.  Recommend:  Follow-up Information     Sharilyn Sites, MD.   Specialty: Family Medicine Why: If worsening or failing to improve as anticipated. Contact information: [address] [place] [id number] [phone]                 Reviewed expectations re: course of current medical issues. Questions answered. Outlined signs and symptoms indicating need for more acute intervention. Patient verbalized understanding. After Visit Summary given.   SUBJECTIVE: History from: patient.  Stephen Daugherty is a 50 y.o. male who presents with complaint of persistent left sided lower back discomfort. Onset gradual. First noted yesterday. Injury/trama: no. History of back problems requiring medical care: occasional. Pain described as aching and without radiation. Aggravating factors: certain movements and prolonged sitting. Alleviating factors: have not been identified. Progressive LE weakness or saddle anesthesia: none. Extremity sensation changes or weakness: none. Ambulatory without difficulty. Normal bowel/bladder habits: yes; without urinary retention. Normal PO intake without n/v. No associated abdominal pain/n/v.  Self treatment: has transdermal analgesic patches without much relief.   OBJECTIVE:  Vitals:   04/29/21 1622  BP: (!) 146/87  Pulse: 85  Resp: 16  Temp: 98.2 F (36.8 C)  TempSrc: Oral  SpO2: 96%    General appearance: alert; no distress HEENT: [place]; AT Neck: supple with FROM; without midline tenderness CV: regular Lungs: unlabored respirations; speaks full sentences without difficulty Abdomen: soft, non-tender; non-distended Back: moderate and poorly localized tenderness to palpation over L lower back, mostly over SI joint ; FROM at waist; bruising: none; without midline tenderness Extremities: without edema; symmetrical without gross deformities; normal ROM of bilateral LE Skin: warm and dry Neurologic: normal gait but slow; normal sensation and strength of bilateral LE Psychological: alert and cooperative; normal mood and affect  Labs: Results for orders placed or performed during the hospital encounter of 04/29/21  POCT urinalysis dipstick  Result Value Ref Range   Color, UA yellow yellow   Clarity, UA clear clear   Glucose, UA negative negative mg/dL   Bilirubin, UA negative negative   Ketones, POC UA negative negative mg/dL   Spec Grav, UA 1.025 1.010 - 1.025   Blood, UA negative negative   pH, UA 7.0 5.0 - 8.0   Protein Ur, POC negative negative mg/dL   Urobilinogen, UA 1.0 0.2 or 1.0 E.U./dL   Nitrite, UA Negative Negative   Leukocytes, UA Negative Negative    No Known Allergies  Past Medical History:  Diagnosis Date   Kidney stone    Social History   Socioeconomic History   Marital status: Single    Spouse name: Not on file  Number of children: Not on file   Years of education: Not on file   Highest education level: Not on file  Occupational History   Not on file  Tobacco Use   Smoking status: Never   Smokeless tobacco: Former    Types: Chew    Quit date: 01/10/2017  Substance and Sexual Activity   Alcohol use: No   Drug use: No   Sexual  activity: Not on file  Other Topics Concern   Not on file  Social History Narrative   Not on file   Social Determinants of Health   Financial Resource Strain: Not on file  Food Insecurity: Not on file  Transportation Needs: Not on file  Physical Activity: Not on file  Stress: Not on file  Social Connections: Not on file  Intimate Partner Violence: Not on file   No family history on file. Past Surgical History:  Procedure Laterality Date   EXTRACORPOREAL SHOCK WAVE LITHOTRIPSY Right 05/13/2020   Procedure: EXTRACORPOREAL SHOCK WAVE LITHOTRIPSY (ESWL);  Surgeon: Malen Gauze, MD;  Location: [facility];  Service: Urology;  Laterality: Right;   SPLENECTOMY, TOTAL     URETHRAL Joya Gaskins        Mardella Layman, MD 04/29/21 [phone]

## 2021-12-03 ENCOUNTER — Ambulatory Visit: Payer: 59 | Admitting: Urology

## 2021-12-10 ENCOUNTER — Ambulatory Visit: Payer: 59 | Admitting: Urology

## 2022-01-06 ENCOUNTER — Other Ambulatory Visit: Payer: Self-pay

## 2022-01-06 ENCOUNTER — Ambulatory Visit (HOSPITAL_COMMUNITY)
Admission: RE | Admit: 2022-01-06 | Discharge: 2022-01-06 | Disposition: A | Discharge status: Home / Self Care | Payer: 59 | Source: Ambulatory Visit | Attending: Urology | Admitting: Urology

## 2022-01-06 DIAGNOSIS — N200 Calculus of kidney: Secondary | ICD-10-CM | POA: Insufficient documentation

## 2022-01-07 ENCOUNTER — Ambulatory Visit: Payer: 59 | Admitting: Urology

## 2022-01-07 VITALS — BP 125/81 | HR 81

## 2022-01-07 DIAGNOSIS — N200 Calculus of kidney: Secondary | ICD-10-CM | POA: Diagnosis not present

## 2022-01-07 DIAGNOSIS — R3129 Other microscopic hematuria: Secondary | ICD-10-CM

## 2022-01-07 NOTE — Progress Notes (Signed)
Subjective:  1. Renal stone   2. Microhematuria    01/07/22: Stephen Daugherty returns in f/u for his history of stones.  He has no hematuria or flank pain since his last visit.  A KUB prior to this visit shows on stones.   12/04/20: Stephen Daugherty returns today in f/u.  He has done well since the last visit and has had no further fragments pass.  He has had no hematuria or flank pain.  His UA today has 3-10 RBC's.  His stone was calcium oxalate.   05/30/20: Stephen Daugherty returns today in f/u from [facility] on 05/13/20 for a right proximal stone.   He has done well and passed several fragments.  He has no pain or hematuria.   He has small bilateral renal stones that were not treated  Stephen Daugherty is a former patient who was last seen in 2017. he has a history of stones and had the onset last Monday morning of right flank pain. He had hematuria on that Monday morning. The blood has cleared and has had no pain since Thursday. He was seen at Urgent Care on Tuesday and was given an antibiotic. He saw Dr. Sherwood Gambler later in the week and was given toradol. He had a CT stone study on 11/18 and was found to have a 5x48mm right proximal stone with obstruction. The stone was adjacent to L3. He has no voiding complaints or fever. His UA today has 20-40 RBC's. His prior stones were calcium. He has a history of a urethral stricture that has required dilation in the past but he isn't having a reduced stream.       ROS:  ROS:  A complete review of systems was performed.  All systems are negative except for pertinent findings as noted.   ROS  No Known Allergies  Outpatient Encounter Medications as of 01/07/2022  Medication Sig   fenofibrate 160 MG tablet Take 160 mg by mouth daily.   lisinopril (ZESTRIL) 10 MG tablet Take 1 tablet (10 mg total) by mouth daily.   Multiple Vitamin (MULTIVITAMIN WITH MINERALS) TABS tablet Take 1 tablet by mouth daily.   [DISCONTINUED] HYDROcodone-acetaminophen (NORCO/VICODIN) 5-325 MG tablet Take 1 tablet by  mouth every 6 (six) hours as needed for moderate pain or severe pain.   [DISCONTINUED] predniSONE (DELTASONE) 20 MG tablet Take 2 tablets (40 mg total) by mouth daily.   [DISCONTINUED] rosuvastatin (CRESTOR) 10 MG tablet Take 10 mg by mouth at bedtime.   No facility-administered encounter medications on file as of 01/07/2022.    Past Medical History:  Diagnosis Date   Kidney stone     Past Surgical History:  Procedure Laterality Date   EXTRACORPOREAL SHOCK WAVE LITHOTRIPSY Right 05/13/2020   Procedure: EXTRACORPOREAL SHOCK WAVE LITHOTRIPSY (ESWL);  Surgeon: Malen Gauze, MD;  Location: [facility];  Service: Urology;  Laterality: Right;   SPLENECTOMY, TOTAL     URETHRAL DILATION      Social History   Socioeconomic History   Marital status: Single    Spouse name: Not on file   Number of children: Not on file   Years of education: Not on file   Highest education level: Not on file  Occupational History   Not on file  Tobacco Use   Smoking status: Never   Smokeless tobacco: Former    Types: Chew    Quit date: 01/10/2017  Substance and Sexual Activity   Alcohol use: No   Drug use: No   Sexual activity: Not on file  Other Topics Concern   Not on file  Social History Narrative   Not on file   Social Determinants of Health   Financial Resource Strain: Not on file  Food Insecurity: Not on file  Transportation Needs: Not on file  Physical Activity: Not on file  Stress: Not on file  Social Connections: Not on file  Intimate Partner Violence: Not on file    No family history on file.     Objective: Vitals:   01/07/22 1436  BP: 125/81  Pulse: 81     Physical Exam  Lab Results:  PSA No results found for: "PSA" No results found for: "TESTOSTERONE" Results for orders placed or performed in visit on 01/07/22 (from the past 24 hour(s))  Urinalysis, Routine w reflex microscopic     Status: None   Collection Time: 01/07/22  2:58 PM  Result Value Ref Range    Specific Gravity, UA 1.020 1.005 - 1.030   pH, UA 6.0 5.0 - 7.5   Color, UA Yellow Yellow   Appearance Ur Clear Clear   Leukocytes,UA Negative Negative   Protein,UA Negative Negative/Trace   Glucose, UA Negative Negative   Ketones, UA Negative Negative   RBC, UA Negative Negative   Bilirubin, UA Negative Negative   Urobilinogen, Ur 1.0 0.2 - 1.0 mg/dL   Nitrite, UA Negative Negative   Microscopic Examination Comment    Narrative   Performed at:  [address] - [facility] [address] [place], [place]  [id number] Lab Director: Chinita Pester MT, Phone:  [phone]       Studies/Results: DG Abd 1 View  Result Date: 01/07/2022 CLINICAL DATA:  Kidney stone. EXAM: ABDOMEN - 1 VIEW COMPARISON:  Radiograph 12/04/2020, most recent CT 05/01/2020 FINDINGS: No visualized radiopaque calculi projecting over the renal beds or course of the ureters. The punctate calculi on prior CT may not be visible by radiograph. Stable distribution of calcifications in the pelvis likely combination of phleboliths and prostatic calcifications. Normal bowel gas pattern. No obstruction. No acute osseous findings. IMPRESSION: No visualized radiopaque calculi. The punctate intrarenal calculi on prior CT may not be visualized by radiograph. Electronically Signed   By: Narda Rutherford M.D.   On: 01/07/2022 14:56   KUB on 7/26 was negative on my review.  He had tiny stones on CT but they are not clear visible on the current film.    Assessment & Plan: Bilateral renal stones.  The ureteral stone was CaOx.    KUB today and in a year.  I will notify him of the results.   Microhematuria.  UA is clear today.   No orders of the defined types were placed in this encounter.    Orders Placed This Encounter  Procedures   DG Abd 1 View    Standing Status:   Future    Standing Expiration Date:   01/08/2023    Order Specific Question:   Reason for Exam (SYMPTOM  OR DIAGNOSIS REQUIRED)    Answer:   renal stones     Order Specific Question:   Preferred imaging location?    Answer:   [facility] - Closed    Order Specific Question:   Radiology Contrast Protocol - do NOT remove file path    Answer:   [email or web address]   Urinalysis, Routine w reflex microscopic      Return in about 1 year (around 01/08/2023) for with KUB.   CC: Assunta Found, MD      Bjorn Pippin 01/08/2022

## 2022-01-08 ENCOUNTER — Encounter: Payer: Self-pay | Admitting: Urology

## 2022-01-08 LAB — URINALYSIS, ROUTINE W REFLEX MICROSCOPIC
Bilirubin, UA: NEGATIVE
Glucose, UA: NEGATIVE
Ketones, UA: NEGATIVE
Leukocytes,UA: NEGATIVE
Nitrite, UA: NEGATIVE
Protein,UA: NEGATIVE
RBC, UA: NEGATIVE
Specific Gravity, UA: 1.02 (ref 1.005–1.030)
Urobilinogen, Ur: 1 mg/dL (ref 0.2–1.0)
pH, UA: 6 (ref 5.0–7.5)

## 2022-04-21 ENCOUNTER — Other Ambulatory Visit: Payer: Self-pay

## 2022-04-21 ENCOUNTER — Ambulatory Visit
Admission: EM | Admit: 2022-04-21 | Discharge: 2022-04-21 | Disposition: A | Discharge status: Home / Self Care | Payer: 59 | Attending: Family Medicine | Admitting: Family Medicine

## 2022-04-21 ENCOUNTER — Encounter: Payer: Self-pay | Admitting: Emergency Medicine

## 2022-04-21 DIAGNOSIS — R52 Pain, unspecified: Secondary | ICD-10-CM | POA: Diagnosis not present

## 2022-04-21 DIAGNOSIS — R509 Fever, unspecified: Secondary | ICD-10-CM | POA: Diagnosis not present

## 2022-04-21 DIAGNOSIS — U071 COVID-19: Secondary | ICD-10-CM | POA: Diagnosis present

## 2022-04-21 DIAGNOSIS — R6883 Chills (without fever): Secondary | ICD-10-CM

## 2022-04-21 LAB — RESP PANEL BY RT-PCR (FLU A&B, COVID) ARPGX2
Influenza A by PCR: NEGATIVE
Influenza B by PCR: NEGATIVE
SARS Coronavirus 2 by RT PCR: POSITIVE — AB

## 2022-04-21 NOTE — ED Provider Notes (Signed)
[facility]    CSN: [id number] Arrival date & time: 04/21/22  1038      History   Chief Complaint Chief Complaint  Patient presents with   Cough    HPI AVIER JECH is a 51 y.o. male.   Patient presenting today with 1 day 3 of fever, chills, body aches, fatigue.  Denies cough, congestion, sore throat, chest pain, shortness of breath, abdominal pain, nausea vomiting or diarrhea.  Took some Tylenol yesterday and today with mild temporary relief of symptoms.  No known sick contacts recently.     Past Medical History:  Diagnosis Date   Kidney stone     There are no problems to display for this patient.   Past Surgical History:  Procedure Laterality Date   EXTRACORPOREAL SHOCK WAVE LITHOTRIPSY Right 05/13/2020   Procedure: EXTRACORPOREAL SHOCK WAVE LITHOTRIPSY (ESWL);  Surgeon: Malen Gauze, MD;  Location: [facility];  Service: Urology;  Laterality: Right;   SPLENECTOMY, TOTAL     URETHRAL DILATION         Home Medications    Prior to Admission medications   Medication Sig Start Date End Date Taking? Authorizing Provider  fenofibrate 160 MG tablet Take 160 mg by mouth daily. 12/18/21   [provider]  lisinopril (ZESTRIL) 10 MG tablet Take 1 tablet (10 mg total) by mouth daily. 04/28/20   Avegno, Zachery Dakins, FNP  Multiple Vitamin (MULTIVITAMIN WITH MINERALS) TABS tablet Take 1 tablet by mouth daily.    [provider]    Family History History reviewed. No pertinent family history.  Social History Social History   Tobacco Use   Smoking status: Never   Smokeless tobacco: Former    Types: Chew    Quit date: 01/10/2017  Substance Use Topics   Alcohol use: No   Drug use: No     Allergies   Patient has no known allergies.   Review of Systems Review of Systems Per HPI  Physical Exam Triage Vital Signs ED Triage Vitals [04/21/22 1046]  Enc Vitals Group     BP 126/77     Pulse Rate 89     Resp 20     Temp 99 F  (37.2 C)     Temp Source Oral     SpO2 96 %     Weight      Height      Head Circumference      Peak Flow      Pain Score 4     Pain Loc      Pain Edu?      Excl. in GC?    No data found.  Updated Vital Signs BP 126/77 (BP Location: Right Arm)   Pulse 89   Temp 99 F (37.2 C) (Oral)   Resp 20   SpO2 96%   Visual Acuity Right Eye Distance:   Left Eye Distance:   Bilateral Distance:    Right Eye Near:   Left Eye Near:    Bilateral Near:     Physical Exam Vitals and nursing note reviewed.  Constitutional:      Appearance: Normal appearance.  HENT:     Head: Atraumatic.     Right Ear: Tympanic membrane normal.     Left Ear: Tympanic membrane normal.     Nose: Nose normal.     Mouth/Throat:     Mouth: Mucous membranes are moist.     Pharynx: Oropharynx is clear.  Eyes:  Extraocular Movements: Extraocular movements intact.     Conjunctiva/sclera: Conjunctivae normal.  Cardiovascular:     Rate and Rhythm: Normal rate and regular rhythm.     Heart sounds: Normal heart sounds.  Pulmonary:     Effort: Pulmonary effort is normal.     Breath sounds: Normal breath sounds.  Musculoskeletal:        General: Normal range of motion.     Cervical back: Normal range of motion and neck supple.  Lymphadenopathy:     Cervical: No cervical adenopathy.  Skin:    General: Skin is warm and dry.  Neurological:     General: No focal deficit present.     Mental Status: He is oriented to person, place, and time.  Psychiatric:        Mood and Affect: Mood normal.        Thought Content: Thought content normal.        Judgment: Judgment normal.      UC Treatments / Results  Labs (all labs ordered are listed, but only abnormal results are displayed) Labs Reviewed  RESP PANEL BY RT-PCR (FLU A&B, COVID) [id number]    EKG   Radiology No results found.  Procedures Procedures (including critical care time)  Medications Ordered in UC Medications - No data to  display  Initial Impression / Assessment and Plan / UC Course  I have reviewed the triage vital signs and the nursing notes.  Pertinent labs & imaging results that were available during my care of the patient were reviewed by me and considered in my medical decision making (see chart for details).     Suspect viral illness, respiratory panel pending, continue over-the-counter pain and fever reducers, fluids, rest.  Work note given.  Return for worsening symptoms.  Final Clinical Impressions(s) / UC Diagnoses   Final diagnoses:  Fever, unspecified  Chills  Generalized body aches   Discharge Instructions   None    ED Prescriptions   None    PDMP not reviewed this encounter.   Particia Nearing, [place] 04/21/22 1113

## 2022-04-21 NOTE — ED Triage Notes (Signed)
Pt reports cough, body aches, chills since last night. Intermittent fever. Last dose of tylenol approximately 6am this am.

## 2022-09-11 IMAGING — CT CT RENAL STONE PROTOCOL
2 of 4 series · 15 of 46 positions shown, 17 images · non-contrast
Comparison: September 12, 2015

CLINICAL DATA: Right flank pain and nausea

EXAM:
CT ABDOMEN AND PELVIS WITHOUT CONTRAST
TECHNIQUE: Multidetector CT imaging of the abdomen and pelvis was performed
following the standard protocol without IV contrast.

[Series 2: axial st · axial · 0.80mm/px · z∈[+242,+656]mm · 12 of 93 slices shown, 14 images]
[im 5/93  soft-tissue]
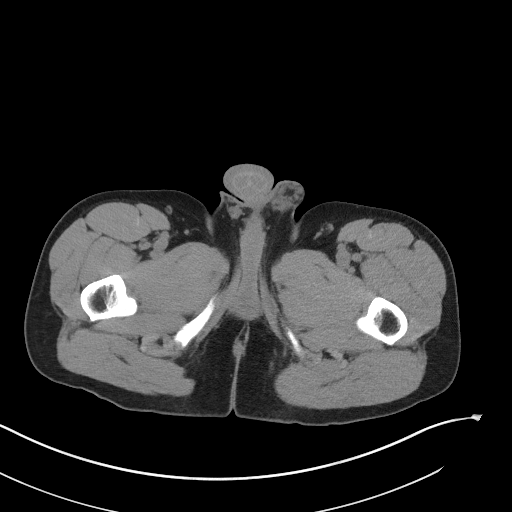
[im 5/93  bone]
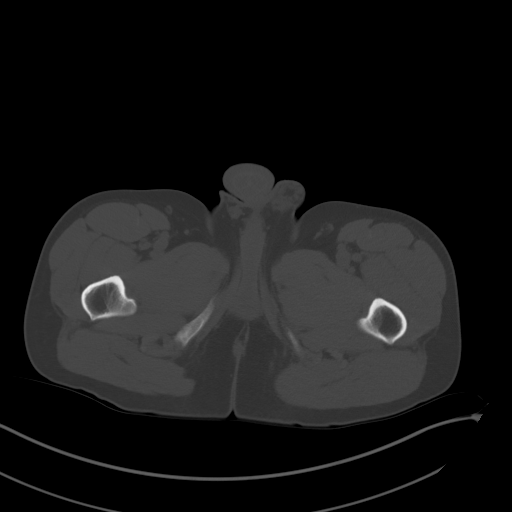
[im 15/93  soft-tissue]
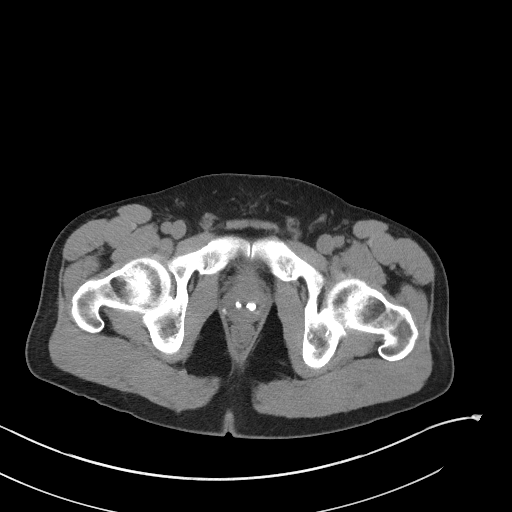
[im 20/93  soft-tissue]
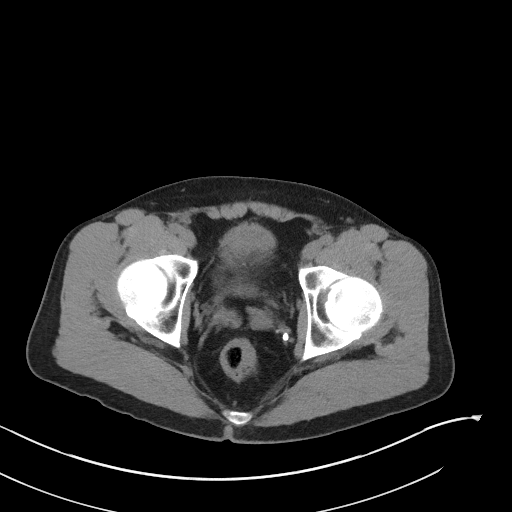
[im 30/93  soft-tissue]
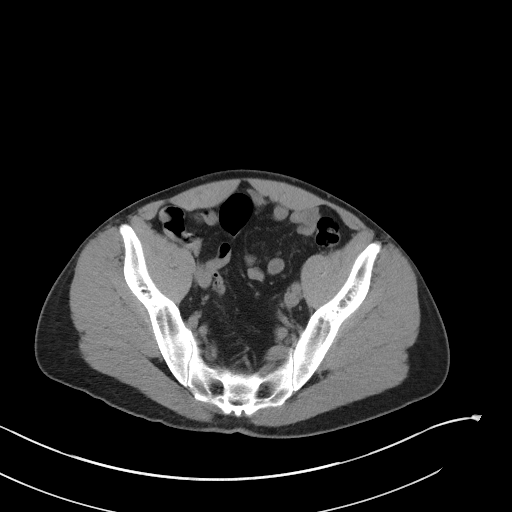
[im 34/93  soft-tissue]
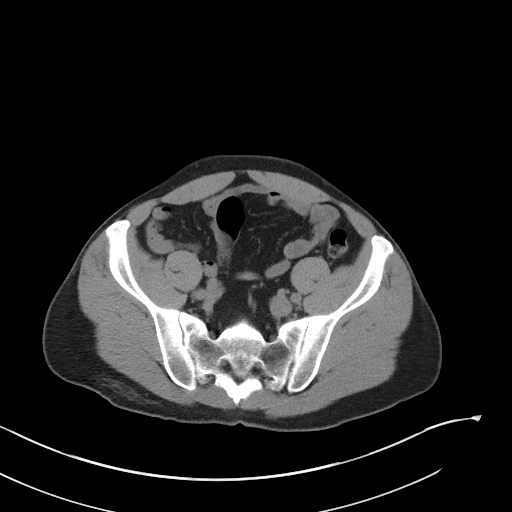
[im 44/93  soft-tissue]
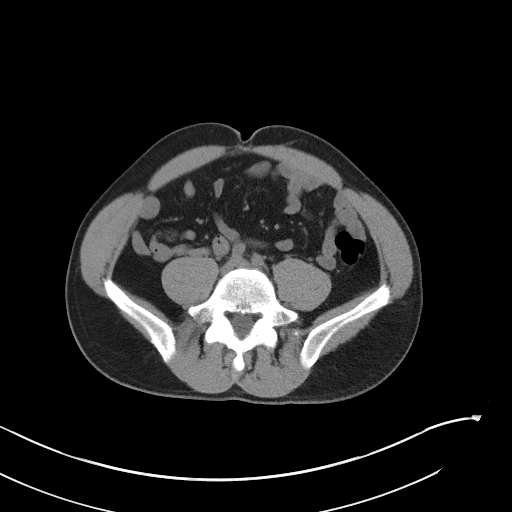
[im 49/93  soft-tissue]
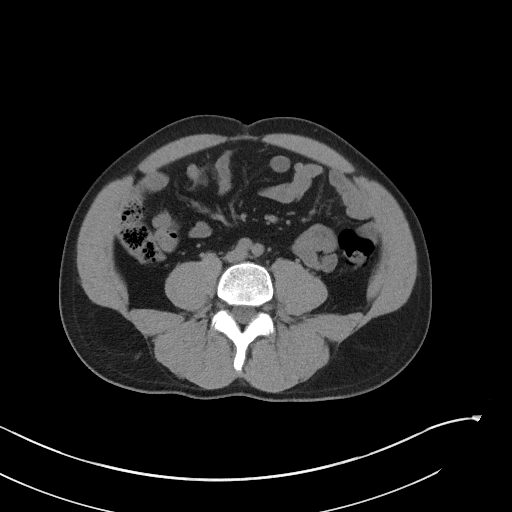
[im 59/93  soft-tissue]
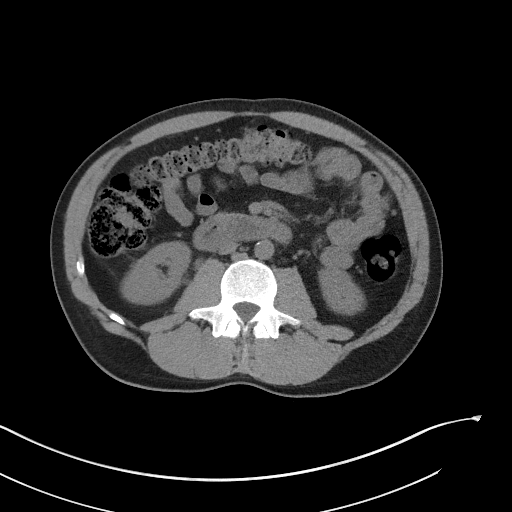
[im 63/93  soft-tissue]
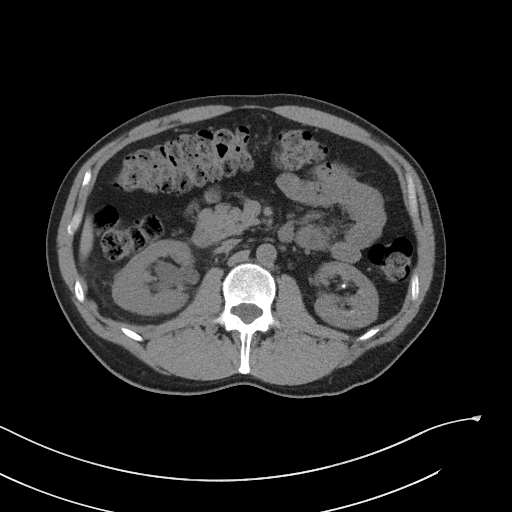
[im 63/93  bone]
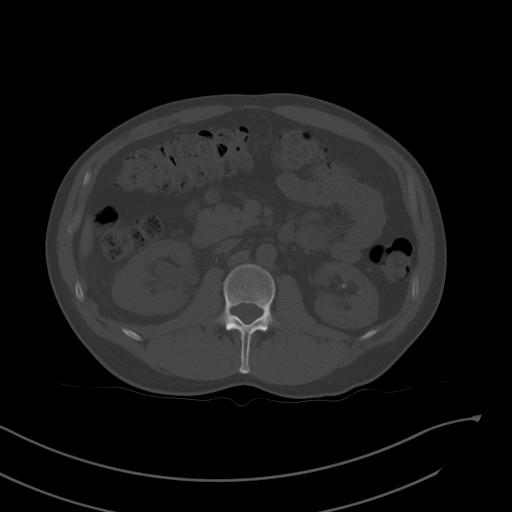
[im 73/93  soft-tissue]
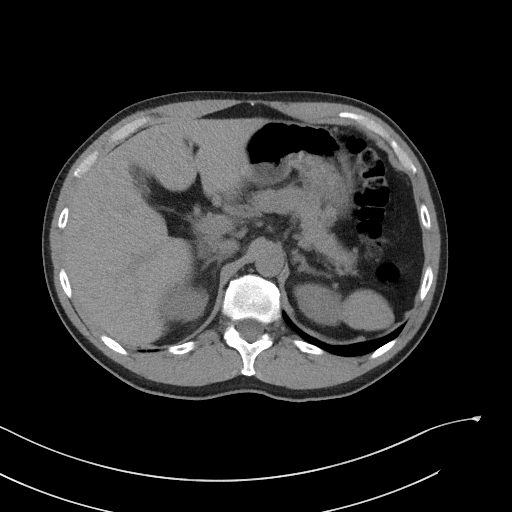
[im 78/93  soft-tissue]
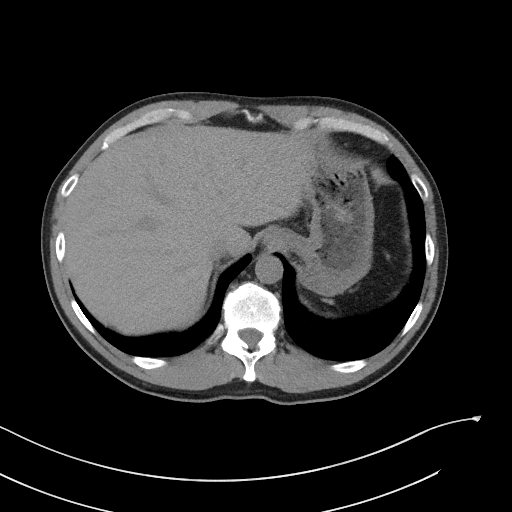
[im 88/93  soft-tissue]
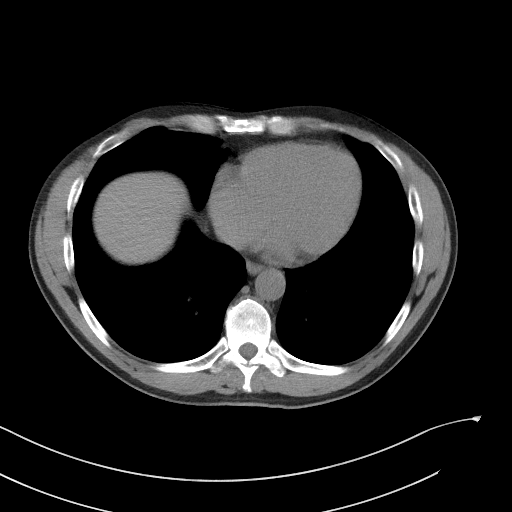

[Series 5: coronal st · coronal · 0.72mm/px · 3 of 101 slices shown]
[im 34/101  soft-tissue]
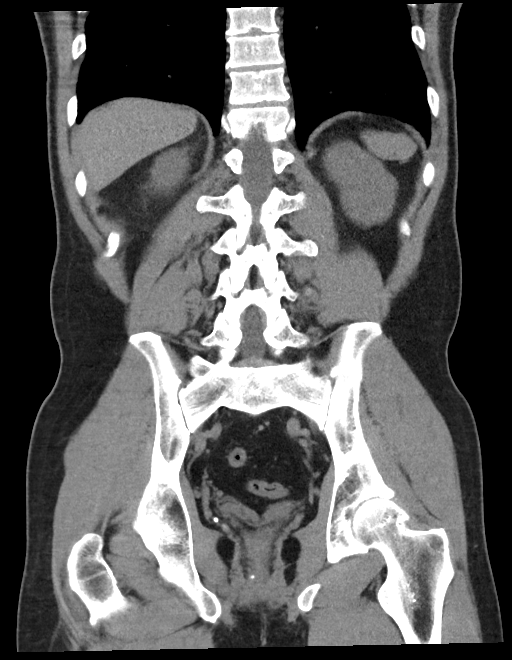
[im 45/101  soft-tissue]
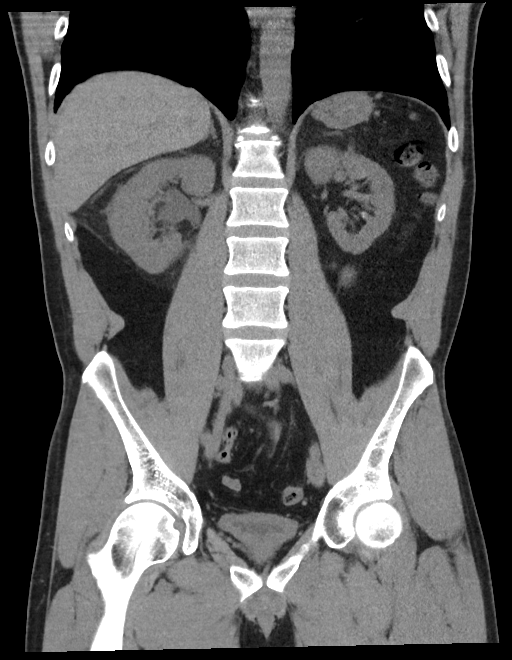
[im 56/101  soft-tissue]
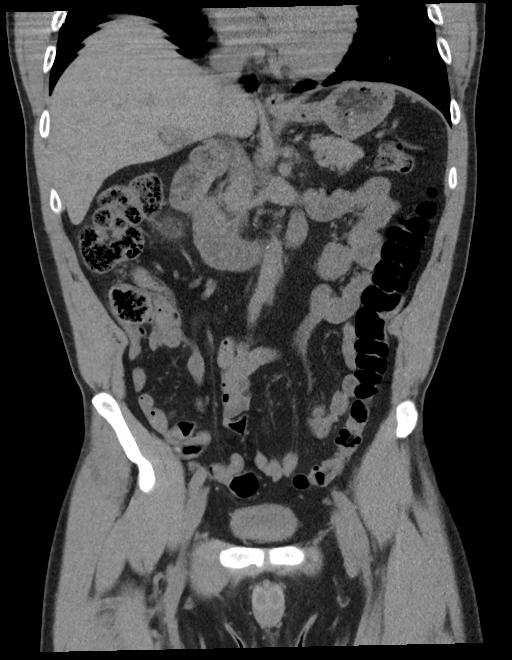

[15 of 46 positions shown; findings below may reference images not displayed]

FINDINGS: Lower chest: Lung bases are clear.

Hepatobiliary: No focal liver lesions are evident on this
noncontrast enhanced study. Gallbladder wall is not appreciably
thickened. There is no biliary duct dilatation.

Pancreas: There is no pancreatic mass or inflammatory focus.

Spleen: Spleen diminutive without focal splenic lesion evident.

Adrenals/Urinary Tract: Adrenals bilaterally appear normal. There is
mild right renal edema. No renal mass evident. There is moderate
hydronephrosis on the right. No hydronephrosis on the left. On the
right, there are several tiny calculi in the lower pole region. On
the left, there is a 1 mm calculus in the lower pole region as well
as a 2 mm calculus in the lower pole region. Multiple tiny calculi
are noted elsewhere throughout the left kidney. On the right, there
is a focal calculus slightly beyond the ureteropelvic junction at
the L3 level in the right ureter measuring 7 x 5 mm. No other
ureteral calculi are evident. The urinary bladder is midline with
generalized urinary bladder wall thickening.

Stomach/Bowel: There is no appreciable bowel wall or mesenteric
thickening. There is no evident bowel obstruction. The terminal
ileum appears normal. There is no appreciable free air or portal
venous air.

Vascular/Lymphatic: There is no abdominal aortic aneurysm. There are
foci of aortic and iliac artery atherosclerosis. No adenopathy is
evident in the abdomen or pelvis.

Reproductive: There are multiple prostatic calculi. Prostate and
seminal vesicles are normal in size and contour.

Other: Appendix region appears normal. No evident abscess or ascites
in the abdomen or pelvis.

Musculoskeletal: No blastic or lytic bone lesions. No intramuscular
or abdominal wall lesions are evident.
IMPRESSION: 1. 7 x 5 mm calculus at the level of L3 in the right ureter causing
moderate hydronephrosis on the right.

2. Urinary bladder wall thickening, a finding indicative of
cystitis.

3. Small nonobstructing calculi in each kidney. Multiple prostatic
calculi evident.

4. No bowel wall thickening or bowel obstruction. No abscess in the
abdomen pelvis. Appendix region appears normal.

These results will be called to the ordering clinician or
representative by the Radiologist Assistant, and communication
documented in the PACS or [REDACTED].

## 2023-01-06 ENCOUNTER — Ambulatory Visit: Payer: 59 | Admitting: Urology

## 2023-01-13 ENCOUNTER — Ambulatory Visit: Payer: 59 | Admitting: Urology

## 2024-06-18 ENCOUNTER — Ambulatory Visit: Admission: EM | Admit: 2024-06-18 | Discharge: 2024-06-18 | Disposition: A | Discharge status: Home / Self Care

## 2024-06-18 DIAGNOSIS — R319 Hematuria, unspecified: Secondary | ICD-10-CM | POA: Diagnosis not present

## 2024-06-18 DIAGNOSIS — N390 Urinary tract infection, site not specified: Secondary | ICD-10-CM | POA: Insufficient documentation

## 2024-06-18 DIAGNOSIS — M545 Low back pain, unspecified: Secondary | ICD-10-CM | POA: Diagnosis not present

## 2024-06-18 DIAGNOSIS — R10A2 Flank pain, left side: Secondary | ICD-10-CM | POA: Diagnosis not present

## 2024-06-18 LAB — POCT URINE DIPSTICK
Bilirubin, UA: NEGATIVE
Glucose, UA: NEGATIVE mg/dL
Nitrite, UA: POSITIVE — AB
POC PROTEIN,UA: 100 — AB
Spec Grav, UA: 1.02
Urobilinogen, UA: 0.2 U/dL
pH, UA: 5.5

## 2024-06-18 MED ORDER — SULFAMETHOXAZOLE-TRIMETHOPRIM 800-160 MG PO TABS: 1.0000 | ORAL_TABLET | Freq: Two times a day (BID) | ORAL | 0 refills | Status: AC

## 2024-06-18 NOTE — Discharge Instructions (Signed)
 We will let you know if your urine culture tells us  we need to make any changes to your antibiotic.  Take the antibiotic, drink plenty of fluids and follow-up for worsening or unresolving symptoms.  You may also continue the lidocaine patches, Aleve, heating pads for your back pain as you may also have some muscular pain in addition to the UTI at this time.

## 2024-06-18 NOTE — ED Triage Notes (Signed)
 Pt states he is having some lower back pain in his left side x 2 weeks that comes and goes. With chills that come and go x 2 days.   Pt states he was moving some boxes around his house 2 weeks ago then the pain started and has not gone away. Taking Advil and using a heating pad.

## 2024-06-18 NOTE — ED Provider Notes (Signed)
 " [facility]    CSN: [id number] Arrival date & time: 06/18/24  1417      History   Chief Complaint Chief Complaint  Patient presents with   Back Pain    HPI Stephen Daugherty is a 54 y.o. male.   Patient presenting today with about 2-week history of waxing and waning left low back aching after moving some boxes.  He states Aleve, heat, rest, massage, lidocaine patches tends to provide some short-term relief but pain worsened over the last few days and is now having chills, fatigue, anorexia.  Denies dysuria, hematuria, vomiting, bowel changes.  Has a history of kidney stones but states the pain feels slightly different than at that time.    Past Medical History:  Diagnosis Date   Kidney stone     There are no active problems to display for this patient.   Past Surgical History:  Procedure Laterality Date   EXTRACORPOREAL SHOCK WAVE LITHOTRIPSY Right 05/13/2020   Procedure: EXTRACORPOREAL SHOCK WAVE LITHOTRIPSY (ESWL);  Surgeon: Sherrilee Belvie CROME, MD;  Location: [facility];  Service: Urology;  Laterality: Right;   SPLENECTOMY, TOTAL     URETHRAL DILATION         Home Medications    Prior to Admission medications  Medication Sig Start Date End Date Taking? Authorizing Provider  lisinopril  (ZESTRIL ) 10 MG tablet Take 1 tablet (10 mg total) by mouth daily. 04/28/20  Yes Avegno, Komlanvi S, FNP  omega-3 acid ethyl esters (LOVAZA) 1 g capsule Take 2 capsules by mouth 2 (two) times daily. 05/28/24  Yes [provider]  rosuvastatin (CRESTOR) 40 MG tablet Take 40 mg by mouth daily. 05/29/24  Yes [provider]  sulfamethoxazole -trimethoprim  (BACTRIM  DS) 800-160 MG tablet Take 1 tablet by mouth 2 (two) times daily for 7 days. 06/18/24 06/25/24 Yes Stuart Vernell Norris, PA-C  fenofibrate 160 MG tablet Take 160 mg by mouth daily. 12/18/21   [provider]  Multiple Vitamin (MULTIVITAMIN WITH MINERALS) TABS tablet Take 1 tablet by mouth daily.     [provider]    Family History History reviewed. No pertinent family history.  Social History Social History[1]   Allergies   Patient has no known allergies.   Review of Systems Review of Systems PER HPI  Physical Exam Triage Vital Signs ED Triage Vitals  Encounter Vitals Group     BP 06/18/24 1531 114/79     Girls Systolic BP Percentile --      Girls Diastolic BP Percentile --      Boys Systolic BP Percentile --      Boys Diastolic BP Percentile --      Pulse Rate 06/18/24 1531 91     Resp 06/18/24 1531 16     Temp 06/18/24 1531 98.8 F (37.1 C)     Temp Source 06/18/24 1531 Oral     SpO2 06/18/24 1531 94 %     Weight --      Height --      Head Circumference --      Peak Flow --      Pain Score 06/18/24 1526 5     Pain Loc --      Pain Education --      Exclude from Growth Chart --    No data found.  Updated Vital Signs BP 114/79 (BP Location: Right Arm)   Pulse 91   Temp 98.8 F (37.1 C) (Oral)   Resp 16   SpO2 94%  Visual Acuity Right Eye Distance:   Left Eye Distance:   Bilateral Distance:    Right Eye Near:   Left Eye Near:    Bilateral Near:     Physical Exam Vitals and nursing note reviewed.  Constitutional:      Appearance: Normal appearance.  HENT:     Head: Atraumatic.     Mouth/Throat:     Mouth: Mucous membranes are moist.  Eyes:     Extraocular Movements: Extraocular movements intact.     Conjunctiva/sclera: Conjunctivae normal.  Cardiovascular:     Rate and Rhythm: Normal rate.  Pulmonary:     Effort: Pulmonary effort is normal.  Abdominal:     General: Bowel sounds are normal. There is no distension.     Palpations: Abdomen is soft.     Tenderness: There is no abdominal tenderness. There is no right CVA tenderness, left CVA tenderness or guarding.  Musculoskeletal:        General: Tenderness present. No swelling, deformity or signs of injury. Normal range of motion.     Cervical back: Normal range of motion  and neck supple.     Comments: No midline spinal tenderness to palpation diffusely.  Negative straight leg raise bilateral lower extremities.  Mild tenderness to palpation to the left lumbar musculature.  Skin:    General: Skin is warm and dry.     Findings: No erythema or rash.  Neurological:     Mental Status: He is oriented to person, place, and time.     Comments: Bilateral lower extremity neurovascularly intact  Psychiatric:        Mood and Affect: Mood normal.        Thought Content: Thought content normal.        Judgment: Judgment normal.    UC Treatments / Results  Labs (all labs ordered are listed, but only abnormal results are displayed) Labs Reviewed  POCT URINE DIPSTICK - Abnormal; Notable for the following components:      Result Value   Ketones, POC UA trace (5) (*)    Blood, UA large (*)    POC PROTEIN,UA =100 (*)    Nitrite, UA Positive (*)    Leukocytes, UA Small (1+) (*)    All other components within normal limits  URINE CULTURE    EKG   Radiology No results found.  Procedures Procedures (including critical care time)  Medications Ordered in UC Medications - No data to display  Initial Impression / Assessment and Plan / UC Course  I have reviewed the triage vital signs and the nursing notes.  Pertinent labs & imaging results that were available during my care of the patient were reviewed by me and considered in my medical decision making (see chart for details).     Urinalysis today with evidence of a urinary tract infection.  He could possibly also have some muscular strain in the lumbar region.  Continue Aleve, heat, lidocaine patches for possible muscular strain and treat with Bactrim  and await urine culture for possible urinary tract infection.  Push fluids, over-the-counter remedies additionally.  Return for worsening or unresolving symptoms.  Final Clinical Impressions(s) / UC Diagnoses   Final diagnoses:  Left flank pain  Urinary tract  infection with hematuria, site unspecified  Acute left-sided low back pain without sciatica     Discharge Instructions      We will let you know if your urine culture tells us  we need to make any changes to your antibiotic.  Take the antibiotic, drink plenty of fluids and follow-up for worsening or unresolving symptoms.  You may also continue the lidocaine patches, Aleve, heating pads for your back pain as you may also have some muscular pain in addition to the UTI at this time.    ED Prescriptions     Medication Sig Dispense Auth. Provider   sulfamethoxazole -trimethoprim  (BACTRIM  DS) 800-160 MG tablet Take 1 tablet by mouth 2 (two) times daily for 7 days. 14 tablet Stuart Vernell Norris, [place]      PDMP not reviewed this encounter.     [1]  Social History Tobacco Use   Smoking status: Never   Smokeless tobacco: Former    Types: Chew    Quit date: 01/10/2017  Substance Use Topics   Alcohol use: No   Drug use: No     Stuart Vernell Norris, [place] 06/18/24 1913  "

## 2024-06-20 ENCOUNTER — Ambulatory Visit: Payer: Self-pay

## 2024-06-20 LAB — URINE CULTURE: Culture: >=100000 — AB
